# Patient Record
Sex: Female | Born: 2000 | Hispanic: Yes | Marital: Single | Smoking: Never smoker
Health system: Southern US, Community
[De-identification: ages and names within clinical notes are randomized; demographics above are authoritative.]

## PROBLEM LIST (undated history)

## (undated) ENCOUNTER — Ambulatory Visit: Payer: Commercial Managed Care - PPO

## (undated) ENCOUNTER — Emergency Department (HOSPITAL_COMMUNITY): Payer: Commercial Managed Care - PPO

---
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
## 2018-01-30 ENCOUNTER — Encounter: Payer: Self-pay | Admitting: Family Medicine

## 2018-01-30 ENCOUNTER — Ambulatory Visit (INDEPENDENT_AMBULATORY_CARE_PROVIDER_SITE_OTHER): Payer: Medicaid Other | Admitting: Family Medicine

## 2018-01-30 VITALS — BP 110/72 | HR 82 | Temp 98.4°F | Resp 17 | Ht 62.0 in | Wt 133.8 lb

## 2018-01-30 DIAGNOSIS — Z23 Encounter for immunization: Secondary | ICD-10-CM

## 2018-01-30 DIAGNOSIS — Z0000 Encounter for general adult medical examination without abnormal findings: Secondary | ICD-10-CM | POA: Diagnosis not present

## 2018-01-30 DIAGNOSIS — Z7689 Persons encountering health services in other specified circumstances: Secondary | ICD-10-CM | POA: Diagnosis not present

## 2018-01-30 DIAGNOSIS — Z130 Encounter for screening for diseases of the blood and blood-forming organs and certain disorders involving the immune mechanism: Secondary | ICD-10-CM

## 2018-01-30 NOTE — Progress Notes (Signed)
Deanna Ward, is a 17 y.o. female  ZOX:[id number]  [id number]:[id number]  DOB - 2000-07-08  CC:  Chief Complaint  Patient presents with  . Establish Care    needs new PCP. will need to return for school CPE       HPI: Deanna Ward is a 17 y.o. female is here today to establish care.   Deanna Ward miscarriage.    Today's visit:  Deanna Ward is here today for a school physical. She is completing medical assistant school .  She is in need of a physical, review of vaccinations records, and completion of school forms. Denies any significant medical history. Patient denies new headaches, chest pain, abdominal pain, nausea, new weakness, numbness or tingling, SOB, edema, or worrisome cough.   Current medications:No current outpatient medications on file.   Pertinent family medical history: family history includes Healthy in her father, mother, sister, sister, and sister.   No Known Allergies  Social History   Socioeconomic History  . Marital status: Single    Spouse name: Not on file  . Number of children: Not on file  . Years of education: Not on file  . Highest education level: Not on file  Occupational History  . Not on file  Social Needs  . Financial resource strain: Not on file  . Food insecurity:    Worry: Not on file    Inability: Not on file  . Transportation needs:    Medical: Not on file    Non-medical: Not on file  Tobacco Use  . Smoking status: Never Smoker  . Smokeless tobacco: Never Used  Substance and Sexual Activity  . Alcohol use: No  . Drug use: No  . Sexual activity: Yes  Lifestyle  . Physical activity:    Days per week: Not on file    Minutes per session: Not on file  . Stress: Not on file  Relationships  . Social connections:    Talks on phone: Not on file    Gets together: Not on file    Attends religious service: Not on file    Active member of club or organization: Not on file    Attends meetings of clubs or organizations: Not on file     Relationship status: Not on file  . Intimate partner violence:    Fear of current or ex partner: Not on file    Emotionally abused: Not on file    Physically abused: Not on file    Forced sexual activity: Not on file  Other Topics Concern  . Not on file  Social History Narrative  . Not on file    Review of Systems: Constitutional: Negative for fever, chills, diaphoresis, activity change, appetite change and fatigue. HENT: Negative for ear pain, nosebleeds, congestion, facial swelling, rhinorrhea, neck pain, neck stiffness and ear discharge.  Eyes: Negative for pain, discharge, redness, itching and visual disturbance. Respiratory: Negative for cough, choking, chest tightness, shortness of breath, wheezing and stridor.  Cardiovascular: Negative for chest pain, palpitations and leg swelling. Gastrointestinal: Negative for abdominal distention. Genitourinary: Negative for dysuria, urgency, frequency, hematuria, flank pain, decreased urine volume, difficulty urinating. Musculoskeletal: Negative for back pain, joint swelling, arthralgia and gait problem. Neurological: Negative for dizziness, tremors, seizures, syncope, facial asymmetry, speech difficulty, weakness, light-headedness, numbness and headaches.  Hematological: Negative for adenopathy. Does not bruise/bleed easily. Psychiatric/Behavioral: Negative for hallucinations, behavioral problems, confusion, dysphoric mood, decreased concentration and agitation.  Objective:   Vitals:   01/30/18 0853  BP: 110/72  Pulse: 82  Resp: 17  Temp: 98.4 F (36.9 C)  SpO2: 99%    BP Readings from Last 3 Encounters:  01/30/18 110/72  05/13/17 112/67  11/17/16 (!) 127/55    Filed Weights   01/30/18 0853  Weight: 133 lb 12.8 oz (60.7 kg)      Physical Exam: Constitutional: Patient appears well-developed and well-nourished. No distress. HENT: Normocephalic, atraumatic, External right and left ear normal. Oropharynx is clear and  moist.  Eyes: Conjunctivae and EOM are normal. PERRLA, no scleral icterus. Neck: Normal ROM. Neck supple. No JVD. No tracheal deviation. No thyromegaly. CVS: RRR, S1/S2 +, no murmurs, no gallops, no carotid bruit.  Pulmonary: Effort and breath sounds normal, no stridor, rhonchi, wheezes, rales.  Abdominal: Soft. BS +, no distension, tenderness, rebound or guarding.  Musculoskeletal: Normal range of motion. No edema and no tenderness.  Neuro: Alert. Normal muscle tone coordination. Normal gait. BUE and BLE strength 5/5.  Bilateral hand grips symmetrical. No cranial nerve deficit. Skin: Skin is warm and dry. No rash noted. Not diaphoretic. No erythema. No pallor. Psychiatric: Normal mood and affect. Behavior, judgment, thought content normal. Lab Results (prior encounters)  Lab Results  Component Value Date   WBC 9.1 11/16/2016   HGB 11.8 (L) 11/16/2016   HCT 35.5 11/16/2016   MCV 75.7 (L) 11/16/2016   PLT 271 11/16/2016   Lab Results  Component Value Date   CREATININE 0.51 11/16/2016   BUN 8 11/16/2016   NA 136 11/16/2016   K 3.7 11/16/2016   CL 103 11/16/2016   CO2 24 11/16/2016       Assessment and plan:  1. Encounter to establish care 2. Screening for deficiency anemia - CBC with Differential  3. Laboratory tests ordered as part of a complete physical exam (CPE) - Hemoglobin A1c - Urinalysis  4. Need for meningococcal vaccination, administered.   5. Annual physical exam Age-specific anticipatory guidance    Orders Placed This Encounter  Procedures  . Tdap vaccine greater than or equal to 7yo IM  . Meningococcal MCV4O(Menveo)  . Hemoglobin A1c  . CBC with Differential  . Urinalysis    Return to pick-up completed forms 1 week from today.   The patient was given clear instructions to go to ER or return to medical center if symptoms don't improve, worsen or new problems develop. The patient verbalized understanding. The patient was advised  to call and obtain  lab results if they haven't heard anything from out office within 7-10 business days.  [name], FNP Primary Care at [facility] [address] [place] [address] [phone]: [phone]    This note has been created with Dragon speech recognition software and Paediatric nurse. Any transcriptional errors are unintentional.

## 2018-01-30 NOTE — Patient Instructions (Signed)
Thank you for choosing Primary Care at [facility] to be your medical home!    Deanna Ward was seen by [name], FNP today.   [name] primary care provider is [name], FNP.   For the best care possible, you should try to see [name], FNP-C whenever you come to the clinic.   We look forward to seeing you again soon!  If you have any questions about your visit today, please call us at [phone] or feel free to reach your primary care provider via MyChart.

## 2018-01-31 LAB — URINALYSIS
Bilirubin, UA: NEGATIVE
Glucose, UA: NEGATIVE
KETONES UA: NEGATIVE
LEUKOCYTES UA: NEGATIVE
Nitrite, UA: NEGATIVE
PROTEIN UA: NEGATIVE
SPEC GRAV UA: 1.024 (ref 1.005–1.030)
Urobilinogen, Ur: 0.2 mg/dL (ref 0.2–1.0)
pH, UA: 5.5 (ref 5.0–7.5)

## 2018-01-31 LAB — CBC WITH DIFFERENTIAL/PLATELET
BASOS ABS: 0 10*3/uL (ref 0.0–0.2)
BASOS: 0 %
EOS (ABSOLUTE): 0.3 10*3/uL (ref 0.0–0.4)
Eos: 4 %
Hematocrit: 34.4 % (ref 34.0–46.6)
Hemoglobin: 10.8 g/dL — ABNORMAL LOW (ref 11.1–15.9)
Immature Grans (Abs): 0 10*3/uL (ref 0.0–0.1)
Immature Granulocytes: 0 %
LYMPHS ABS: 2 10*3/uL (ref 0.7–3.1)
LYMPHS: 29 %
MCH: 25.8 pg — AB (ref 26.6–33.0)
MCHC: 31.4 g/dL — AB (ref 31.5–35.7)
MCV: 82 fL (ref 79–97)
MONOCYTES: 6 %
Monocytes Absolute: 0.4 10*3/uL (ref 0.1–0.9)
NEUTROS ABS: 4.1 10*3/uL (ref 1.4–7.0)
NEUTROS PCT: 61 %
PLATELETS: 256 10*3/uL (ref 150–450)
RBC: 4.18 x10E6/uL (ref 3.77–5.28)
RDW: 14.5 % (ref 12.3–15.4)
WBC: 6.8 10*3/uL (ref 3.4–10.8)

## 2018-01-31 LAB — HEMOGLOBIN A1C
ESTIMATED AVERAGE GLUCOSE: 111 mg/dL
HEMOGLOBIN A1C: 5.5 % (ref 4.8–5.6)

## 2018-02-02 ENCOUNTER — Telehealth: Payer: Self-pay | Admitting: Family Medicine

## 2018-02-02 MED ORDER — FERROUS SULFATE 325 (65 FE) MG PO TABS: 325.0000 mg | ORAL_TABLET | Freq: Two times a day (BID) | ORAL | 3 refills | Status: DC

## 2018-02-02 NOTE — Telephone Encounter (Signed)
Contact patient to advise of low hemoglobin. Prescribing iron replacement ferrous 325 mg twice daily with meals. Would like for her to return in 3 months to recheck hemoglobin. Please complete the remainder of the physical form and contact patient tomorrow to pick-up paperwork.    [name], FNP Primary Care at [facility] [address] [place] [address] [phone]: [phone]

## 2018-02-02 NOTE — Addendum Note (Signed)
Addended by: [name] on: 02/02/2018 11:13 AM   Modules accepted: Orders

## 2018-02-02 NOTE — Telephone Encounter (Signed)
LTMCB 

## 2018-02-04 NOTE — Telephone Encounter (Signed)
LMTCB

## 2018-02-04 NOTE — Telephone Encounter (Signed)
Patient returned call. Went over lab results & recommendations. Patient expressed understanding. Will call back to schedule lab appt.

## 2018-02-17 ENCOUNTER — Ambulatory Visit: Payer: Medicaid Other | Admitting: Family Medicine

## 2018-03-03 ENCOUNTER — Encounter: Payer: Self-pay | Admitting: Family Medicine

## 2018-03-03 ENCOUNTER — Ambulatory Visit (INDEPENDENT_AMBULATORY_CARE_PROVIDER_SITE_OTHER): Payer: Medicaid Other | Admitting: Family Medicine

## 2018-03-03 VITALS — BP 113/75 | HR 81 | Resp 17 | Ht 62.0 in | Wt 130.0 lb

## 2018-03-03 DIAGNOSIS — N921 Excessive and frequent menstruation with irregular cycle: Secondary | ICD-10-CM | POA: Insufficient documentation

## 2018-03-03 DIAGNOSIS — D509 Iron deficiency anemia, unspecified: Secondary | ICD-10-CM | POA: Insufficient documentation

## 2018-03-03 DIAGNOSIS — Z113 Encounter for screening for infections with a predominantly sexual mode of transmission: Secondary | ICD-10-CM

## 2018-03-03 DIAGNOSIS — N920 Excessive and frequent menstruation with regular cycle: Secondary | ICD-10-CM | POA: Insufficient documentation

## 2018-03-03 NOTE — Progress Notes (Signed)
Acute Office Visit  Subjective:    Patient ID: Deanna Ward, female    DOB: July 11, 2000, 17 y.o.   MRN: [id number]  Chief Complaint  Patient presents with  . Menstrual Problem    has been having two 6 day periods a month since September. no hx of irregular periods    HPI Patient is in today for for evaluation of irregular menstrual periods.  She reports since September she has had at least 2 episodes of menstrual bleeding per month.  The menstrual bleeding episodes are now lasting on average 6 days per cycle.  On average her normal cycle last anywhere from 3 to 5 days.  She has never had a issue with frequent menstrual bleeding in the past.  She reports that periods are painless without cramping.  She is soaking on average 1 pack/h upon awakening in the morning her pads are soaked during these menstrual periods.  She reports that she has a history of heavy menstrual periods which include clotting.  No prior evaluation for underlying cause of such heavy cycles.  She also has had a spontaneous abortion last year at about 5 weeks.  gestation.  No known cause was identified for miscarriage. She is sexually active denies any known exposures to STDs.  Family History  Problem Relation Age of Onset  . Healthy Mother   . Healthy Father   . Healthy Sister   . Healthy Sister   . Healthy Sister   . Stroke Neg Hx   . Heart disease Neg Hx   . Cancer Neg Hx     Social History   Socioeconomic History  . Marital status: Single    Spouse name: Not on file  . Number of children: Not on file  . Years of education: Not on file  . Highest education level: Not on file  Occupational History  . Not on file  Social Needs  . Financial resource strain: Not on file  . Food insecurity:    Worry: Not on file    Inability: Not on file  . Transportation needs:    Medical: Not on file    Non-medical: Not on file  Tobacco Use  . Smoking status: Never Smoker  . Smokeless tobacco: Never Used   Substance and Sexual Activity  . Alcohol use: No  . Drug use: No  . Sexual activity: Yes  Lifestyle  . Physical activity:    Days per week: Not on file    Minutes per session: Not on file  . Stress: Not on file  Relationships  . Social connections:    Talks on phone: Not on file    Gets together: Not on file    Attends religious service: Not on file    Active member of club or organization: Not on file    Attends meetings of clubs or organizations: Not on file    Relationship status: Not on file  . Intimate partner violence:    Fear of current or ex partner: Not on file    Emotionally abused: Not on file    Physically abused: Not on file    Forced sexual activity: Not on file  Other Topics Concern  . Not on file  Social History Narrative  . Not on file    Outpatient Medications Prior to Visit  Medication Sig Dispense Refill  . ferrous sulfate (FERROUSUL) 325 (65 FE) MG tablet Take 1 tablet (325 mg total) by mouth 2 (two) times daily with a  meal. 60 tablet 3   No facility-administered medications prior to visit.     No Known Allergies  ROS See HPI for pertinent negatives    Objective:    Physical Exam  BP 113/75   Pulse 81   Resp 17   Ht 5\' 2"  (1.575 m)   Wt 130 lb (59 kg)   LMP 02/19/2018 (Exact Date)   SpO2 98%   BMI 23.78 kg/m  Wt Readings from Last 3 Encounters:  03/03/18 130 lb (59 kg) (55 %, Z= 0.13)*  01/30/18 133 lb 12.8 oz (60.7 kg) (62 %, Z= 0.30)*  05/13/17 148 lb (67.1 kg) (81 %, Z= 0.89)*   * Growth percentiles are based on CDC (Girls, 2-20 Years) data.   BP 113/75   Pulse 81   Resp 17   Ht 5\' 2"  (1.575 m)   Wt 130 lb (59 kg)   LMP 02/19/2018 (Exact Date)   SpO2 98%   BMI 23.78 kg/m  General appearance: alert, cooperative and appears stated age Head: Normocephalic, without obvious abnormality, atraumatic Throat: lips, mucosa, and tongue normal; teeth and gums normal Lungs: clear to auscultation bilaterally Heart: regular rate and  rhythm, S1, S2 normal, no murmur, click, rub or gallop Abdomen: soft, non-tender; bowel sounds normal; no masses,  no organomegaly Skin: Skin color, texture, turgor normal. No rashes or lesions Health Maintenance Due  Topic Date Due  . CHLAMYDIA SCREENING  10/09/2013  . HIV Screening  10/09/2013  . INFLUENZA VACCINE  10/30/2017    There are no preventive care reminders to display for this patient.   No results found for: TSH Lab Results  Component Value Date   WBC 6.8 01/30/2018   HGB 10.8 (L) 01/30/2018   HCT 34.4 01/30/2018   MCV 82 01/30/2018   PLT 256 01/30/2018   Lab Results  Component Value Date   NA 136 11/16/2016   K 3.7 11/16/2016   CO2 24 11/16/2016   GLUCOSE 88 11/16/2016   BUN 8 11/16/2016   CREATININE 0.51 11/16/2016   BILITOT 0.4 11/16/2016   ALKPHOS 66 11/16/2016   AST 24 11/16/2016   ALT 23 11/16/2016   PROT 7.6 11/16/2016   ALBUMIN 4.2 11/16/2016   CALCIUM 9.6 11/16/2016   ANIONGAP 9 11/16/2016   Lab Results  Component Value Date   HGBA1C 5.5 01/30/2018       Assessment & Plan:   Problem List Items Addressed This Visit    None    Visit Diagnoses    Menorrhagia with irregular cycle    -  Primary   Relevant Orders   CBC with Differential   Excessive or frequent menstruation       Relevant Orders   CBC with Differential   Iron deficiency anemia, unspecified iron deficiency anemia type       Relevant Orders   Iron, TIBC and Ferritin Panel      1. Menorrhagia with irregular cycle - CBC with Differential - US Pelvic Complete With Transvaginal; Future  2. Excessive or frequent menstruation - CBC with Differential - US Pelvic Complete With Transvaginal; Future  3. Iron deficiency anemia, unspecified iron deficiency anemia type -We will repeat a CBC and iron panel.  Recommended continuing daily iron supplements as previously prescribed.  Last hemoglobin was 10.8. - Iron, TIBC and Ferritin Panel  4. Screen for STD (sexually transmitted  disease) - GC/Chlamydia Probe Amp([facility])  Advised patient if blood work, STD screen, ultrasound yielded no definitive calls of irregularity and  bleeding will start oral contraceptive therapy try to regulate periods and if warranted refer her to GYN.  Orders Placed This Encounter  Procedures  . GC/Chlamydia Probe Amp([facility])  . US Pelvic Complete With Transvaginal  . CBC with Differential  . Iron, TIBC and Ferritin Panel    Joaquin CourtsKimberly Alya Smaltz, FNP [facility] [address], [place] [id number] [phone]: [phone]

## 2018-03-03 NOTE — Patient Instructions (Signed)
-  You will be notified once your ultrasound is scheduled.  - continue iron supplements as prescribed  -you will be notified of any abnormal labs  -if no cause is identified for menstrual irregularity, I will start you on oral contraceptives in efforts to regulate your cycle.     Dysfunctional Uterine Bleeding Dysfunctional uterine bleeding is abnormal bleeding from the uterus. Dysfunctional uterine bleeding includes:  A period that comes earlier or later than usual.  A period that is lighter, heavier, or has blood clots.  Bleeding between periods.  Skipping one or more periods.  Bleeding after sexual intercourse.  Bleeding after menopause.  Follow these instructions at home: Pay attention to any changes in your symptoms. Follow these instructions to help with your condition: Eating and drinking  Eat well-balanced meals. Include foods that are high in iron, such as liver, meat, shellfish, green leafy vegetables, and eggs.  If you become constipated: ? Drink plenty of water. ? Eat fruits and vegetables that are high in water and fiber, such as spinach, carrots, raspberries, apples, and mango. Medicines  Take over-the-counter and prescription medicines only as told by your health care provider.  Do not change medicines without talking with your health care provider.  Aspirin or medicines that contain aspirin may make the bleeding worse. Do not take those medicines: ? During the week before your period. ? During your period.  If you were prescribed iron pills, take them as told by your health care provider. Iron pills help to replace iron that your body loses because of this condition. Activity  If you need to change your sanitary pad or tampon more than one time every 2 hours: ? Lie in bed with your feet raised (elevated). ? Place a cold pack on your lower abdomen. ? Rest as much as possible until the bleeding stops or slows down.  Do not try to lose weight until the  bleeding has stopped and your blood iron level is back to normal. Other Instructions  For two months, write down: ? When your period starts. ? When your period ends. ? When any abnormal bleeding occurs. ? What problems you notice.  Keep all follow up visits as told by your health care provider. This is important. Contact a health care provider if:  You get light-headed or weak.  You have nausea and vomiting.  You cannot eat or drink without vomiting.  You feel dizzy or have diarrhea while you are taking medicines.  You are taking birth control pills or hormones, and you want to change them or stop taking them. Get help right away if:  You develop a fever or chills.  You need to change your sanitary pad or tampon more than one time per hour.  Your bleeding becomes heavier, or your flow contains clots more often.  You develop pain in your abdomen.  You lose consciousness.  You develop a rash. This information is not intended to replace advice given to you by your health care provider. Make sure you discuss any questions you have with your health care provider. Document Released: 03/15/2000 Document Revised: 08/24/2015 Document Reviewed: 06/13/2014 Elsevier Interactive Patient Education  [employer]2018 Elsevier Inc.

## 2018-03-04 LAB — CBC WITH DIFFERENTIAL/PLATELET
BASOS ABS: 0 10*3/uL (ref 0.0–0.2)
Basos: 0 %
EOS (ABSOLUTE): 0.1 10*3/uL (ref 0.0–0.4)
EOS: 2 %
HEMOGLOBIN: 12.4 g/dL (ref 11.1–15.9)
Hematocrit: 37.1 % (ref 34.0–46.6)
Immature Grans (Abs): 0 10*3/uL (ref 0.0–0.1)
Immature Granulocytes: 0 %
LYMPHS: 28 %
Lymphocytes Absolute: 1.7 10*3/uL (ref 0.7–3.1)
MCH: 28.5 pg (ref 26.6–33.0)
MCHC: 33.4 g/dL (ref 31.5–35.7)
MCV: 85 fL (ref 79–97)
MONOCYTES: 6 %
Monocytes Absolute: 0.4 10*3/uL (ref 0.1–0.9)
NEUTROS ABS: 4 10*3/uL (ref 1.4–7.0)
Neutrophils: 64 %
Platelets: 273 10*3/uL (ref 150–450)
RBC: 4.35 x10E6/uL (ref 3.77–5.28)
RDW: 16.7 % — ABNORMAL HIGH (ref 12.3–15.4)
WBC: 6.3 10*3/uL (ref 3.4–10.8)

## 2018-03-04 LAB — IRON,TIBC AND FERRITIN PANEL
FERRITIN: 32 ng/mL (ref 15–77)
Iron Saturation: 18 % (ref 15–55)
Iron: 63 ug/dL (ref 27–159)
TIBC: 347 ug/dL (ref 250–450)
UIBC: 284 ug/dL (ref 131–425)

## 2018-03-04 LAB — GC/CHLAMYDIA PROBE AMP
Chlamydia trachomatis, NAA: NEGATIVE
Neisseria gonorrhoeae by PCR: NEGATIVE

## 2018-03-05 ENCOUNTER — Telehealth: Payer: Self-pay | Admitting: Family Medicine

## 2018-03-05 NOTE — Telephone Encounter (Signed)
Please schedule ultrasound which is noted and orders.  This note completed.  Also advised patient that all of her recent labs were unremarkable and within expected range.  I do recommend she proceed with ultrasound.

## 2018-03-06 NOTE — Telephone Encounter (Signed)
Called [employer] to get US approved. Approval number [id number].

## 2018-03-06 NOTE — Telephone Encounter (Signed)
US is scheduled for 03/16/18 @ 11:30. [facility]15 with full bladder.  Called patient & notified of her lab results as well as ultrasound appointment.

## 2018-03-11 NOTE — Telephone Encounter (Signed)
CPT [id number] authorization # [id number]

## 2018-03-16 ENCOUNTER — Ambulatory Visit (HOSPITAL_COMMUNITY)
Admission: RE | Admit: 2018-03-16 | Discharge: 2018-03-16 | Disposition: A | Discharge status: Home / Self Care | Payer: Medicaid Other | Source: Ambulatory Visit | Attending: Family Medicine | Admitting: Family Medicine

## 2018-03-16 DIAGNOSIS — N920 Excessive and frequent menstruation with regular cycle: Secondary | ICD-10-CM

## 2018-03-16 DIAGNOSIS — N921 Excessive and frequent menstruation with irregular cycle: Secondary | ICD-10-CM

## 2018-03-17 ENCOUNTER — Other Ambulatory Visit: Payer: Self-pay | Admitting: Family Medicine

## 2018-03-17 DIAGNOSIS — N939 Abnormal uterine and vaginal bleeding, unspecified: Secondary | ICD-10-CM

## 2018-03-17 NOTE — Telephone Encounter (Signed)
Patient called stating that she has made a decision and would like to speak to CMA, please follow up.

## 2018-03-17 NOTE — Telephone Encounter (Signed)
Contact patient to advise recent ultrasound showed no abnormalities or cause for abnormal menstraul  bleeding patterns. I would like to start her on oral contraceptives (loestrin) in efforts to regulate her cycle and refer her to gynecology for a second opinion. If she is agreeable, I can send medication to pharmacy and process referral.

## 2018-03-17 NOTE — Telephone Encounter (Signed)
Called patient. She states that she would like to think everything over & then give us a call back with decision regarding OCP & GYN referral.

## 2018-03-18 MED ORDER — NORETHIN ACE-ETH ESTRAD-FE 1.5-30 MG-MCG PO TABS: 1.0000 | ORAL_TABLET | Freq: Every day | ORAL | 12 refills | Status: DC

## 2018-03-18 NOTE — Addendum Note (Signed)
Addended by: Heidi DachWALKER, Wakeelah Solan M on: 03/18/2018 10:41 AM   Modules accepted: Orders

## 2018-03-18 NOTE — Telephone Encounter (Signed)
Patient has decided to try the OCP. Will send to her pharmacy.

## 2018-06-01 ENCOUNTER — Telehealth: Payer: Self-pay | Admitting: Obstetrics

## 2018-06-01 NOTE — Telephone Encounter (Signed)
Deanna Ward Self [phone]  Wendolyn left a voice message with her phone number to call back, I called back and unable to leave voice mail, The voice mail is not set up.

## 2018-06-10 ENCOUNTER — Encounter: Payer: Self-pay | Admitting: Family Medicine

## 2018-06-10 ENCOUNTER — Ambulatory Visit (INDEPENDENT_AMBULATORY_CARE_PROVIDER_SITE_OTHER): Payer: Medicaid Other | Admitting: Family Medicine

## 2018-06-10 ENCOUNTER — Other Ambulatory Visit: Payer: Self-pay

## 2018-06-10 VITALS — BP 112/72 | HR 87 | Resp 17 | Ht 62.0 in | Wt 135.4 lb

## 2018-06-10 DIAGNOSIS — R21 Rash and other nonspecific skin eruption: Secondary | ICD-10-CM | POA: Diagnosis not present

## 2018-06-10 DIAGNOSIS — J302 Other seasonal allergic rhinitis: Secondary | ICD-10-CM

## 2018-06-10 MED ORDER — CETIRIZINE HCL 10 MG PO TABS: 10.0000 mg | ORAL_TABLET | Freq: Every day | ORAL | 11 refills | Status: DC

## 2018-06-10 MED ORDER — CLOTRIMAZOLE-BETAMETHASONE 1-0.05 % EX CREA: 1.0000 "application " | TOPICAL_CREAM | Freq: Two times a day (BID) | CUTANEOUS | 1 refills | Status: DC

## 2018-06-10 MED ORDER — FLUTICASONE PROPIONATE 50 MCG/ACT NA SUSP: 2.0000 | Freq: Every day | NASAL | 6 refills | Status: DC

## 2018-06-10 MED ORDER — SULFAMETHOXAZOLE-TRIMETHOPRIM 800-160 MG PO TABS: 1.0000 | ORAL_TABLET | Freq: Two times a day (BID) | ORAL | 0 refills | Status: DC

## 2018-06-10 NOTE — Progress Notes (Signed)
Patient ID: Deanna Ward, female    DOB: 2001/02/26, 18 y.o.   MRN: [id number]  PCP: [name], FNP  Chief Complaint  Patient presents with  . Rash    red, splotchy, itchy rash on her chest x 1 month. changed detergent w/no relief.    Subjective:  HPI  Deanna Ward is a 18 y.o. female presents for evaluation skin rash localized to chest. Onset: October initially notices red rash on chest with small acne / pustular type lesions present Location: Chest extending to lower neck Duration: Current flare of rash present for one month Symptoms: Itching and irritations  Exacerbating factors: Unknown. She discontinued laundry detergent and lotions. This did not improve rash. Alleviating factors: None identified  OTC/Prescription medications used: Evaluation: None previously  Patient endorses no prior contact with an animal, plant, or experience an insect bite. Unrelated complaint, patient is requesting medication for chronic seasonal allergies. Social History   Socioeconomic History  . Marital status: Single    Spouse name: Not on file  . Number of children: Not on file  . Years of education: Not on file  . Highest education level: Not on file  Occupational History  . Not on file  Social Needs  . Financial resource strain: Not on file  . Food insecurity:    Worry: Not on file    Inability: Not on file  . Transportation needs:    Medical: Not on file    Non-medical: Not on file  Tobacco Use  . Smoking status: Never Smoker  . Smokeless tobacco: Never Used  Substance and Sexual Activity  . Alcohol use: No  . Drug use: No  . Sexual activity: Yes  Lifestyle  . Physical activity:    Days per week: Not on file    Minutes per session: Not on file  . Stress: Not on file  Relationships  . Social connections:    Talks on phone: Not on file    Gets together: Not on file    Attends religious service: Not on file    Active member of club or organization: Not  on file    Attends meetings of clubs or organizations: Not on file    Relationship status: Not on file  . Intimate partner violence:    Fear of current or ex partner: Not on file    Emotionally abused: Not on file    Physically abused: Not on file    Forced sexual activity: Not on file  Other Topics Concern  . Not on file  Social History Narrative  . Not on file    Family History  Problem Relation Age of Onset  . Healthy Mother   . Healthy Father   . Healthy Sister   . Healthy Sister   . Healthy Sister   . Stroke Neg Hx   . Heart disease Neg Hx   . Cancer Neg Hx      Review of Systems Pertinent negatives listed in HPI Patient Active Problem List   Diagnosis Date Noted  . Menorrhagia with irregular cycle 03/03/2018  . Excessive or frequent menstruation 03/03/2018  . Iron deficiency anemia 03/03/2018    No Known Allergies  Prior to Admission medications   Medication Sig Start Date End Date Taking? Authorizing Provider  ferrous sulfate (FERROUSUL) 325 (65 FE) MG tablet Take 1 tablet (325 mg total) by mouth 2 (two) times daily with a meal. 02/02/18  Yes [name], FNP  norethindrone-ethinyl estradiol-iron (MICROGESTIN  FE,GILDESS FE,LOESTRIN FE) 1.5-30 MG-MCG tablet Take 1 tablet by mouth daily. 03/18/18  Yes [name], FNP    Past Medical, Surgical Family and Social History reviewed and updated.    Objective:   Today's Vitals   06/10/18 1027  BP: 112/72  Pulse: 87  Resp: 17  SpO2: 96%  Weight: 135 lb 6.4 oz (61.4 kg)  Height: 5\' 2"  (1.575 m)    Wt Readings from Last 3 Encounters:  06/10/18 135 lb 6.4 oz (61.4 kg) (63 %, Z= 0.33)*  03/03/18 130 lb (59 kg) (55 %, Z= 0.13)*  01/30/18 133 lb 12.8 oz (60.7 kg) (62 %, Z= 0.30)*   * Growth percentiles are based on CDC (Girls, 2-20 Years) data.     Physical Exam General appearance: alert, well developed, well nourished, cooperative and in no distress Head: Normocephalic, without obvious  abnormality, atraumatic Respiratory: Respirations even and unlabored, normal respiratory rate Heart: rate and rhythm normal. No gallop or murmurs noted on exam  Extremities: No gross deformities Skin: Erythematous rash with fine pustular lesion    Psych: Appropriate mood and affect. Neurologic: Mental status: Alert, oriented to person, place, and time, thought content appropriate.  No results found for: POCGLU  Lab Results  Component Value Date   HGBA1C 5.5 01/30/2018      Assessment & Plan:  1. Rash and nonspecific skin eruption -suspect MRSA/Staph WOUND CULTURE pending -Trial antibiotic treatment with Bactrim  -For irritation will trial Lotrisone cream BID as needed for itching   2. Seasonal allergies Trial Cetrizine and Flonase   Orders Placed This Encounter  Procedures  . WOUND CULTURE   Meds ordered this encounter  Medications  . sulfamethoxazole-trimethoprim (BACTRIM DS,SEPTRA DS) 800-160 MG tablet    Sig: Take 1 tablet by mouth 2 (two) times daily.    Dispense:  10 tablet    Refill:  0  . clotrimazole-betamethasone (LOTRISONE) cream    Sig: Apply 1 application topically 2 (two) times daily.    Dispense:  60 g    Refill:  1  . cetirizine (ZYRTEC) 10 MG tablet    Sig: Take 1 tablet (10 mg total) by mouth daily.    Dispense:  30 tablet    Refill:  11  . fluticasone (FLONASE) 50 MCG/ACT nasal spray    Sig: Place 2 sprays into both nostrils daily.    Dispense:  16 g    Refill:  6     -The patient was given clear instructions to go to ER or return to medical center if symptoms do not improve, worsen or new problems develop. The patient verbalized understanding.    [name], FNP Primary Care at [facility] [address] [place] [address] [phone]: [phone]

## 2018-06-10 NOTE — Patient Instructions (Signed)
Rash, Adult    A rash is a change in the color of your skin. A rash can also change the way your skin feels. There are many different conditions and factors that can cause a rash.  Follow these instructions at home:  The goal of treatment is to stop the itching and keep the rash from spreading. Watch for any changes in your symptoms. Let your doctor know about them. Follow these instructions to help with your condition:  Medicine  Take or apply over-the-counter and prescription medicines only as told by your doctor. These may include medicines:   To treat red or swollen skin (corticosteroid creams).   To treat itching.   To treat an allergy (oral antihistamines).   To treat very bad symptoms (oral corticosteroids).    Skin care   Put cool cloths (compresses) on the affected areas.   Do not scratch or rub your skin.   Avoid covering the rash. Make sure that the rash is exposed to air as much as possible.  Managing itching and discomfort   Avoid hot showers or baths. These can make itching worse. A cold shower may help.   Try taking a bath with:  ? Epsom salts. You can get these at your local pharmacy or grocery store. Follow the instructions on the package.  ? Baking soda. Pour a small amount into the bath as told by your doctor.  ? Colloidal oatmeal. You can get this at your local pharmacy or grocery store. Follow the instructions on the package.   Try putting baking soda paste onto your skin. Stir water into baking soda until it gets like a paste.   Try putting on a lotion that relieves itchiness (calamine lotion).   Keep cool and out of the sun. Sweating and being hot can make itching worse.  General instructions     Rest as needed.   Drink enough fluid to keep your pee (urine) pale yellow.   Wear loose-fitting clothing.   Avoid scented soaps, detergents, and perfumes. Use gentle soaps, detergents, perfumes, and other cosmetic products.   Avoid anything that causes your rash. Keep a journal to  help track what causes your rash. Write down:  ? What you eat.  ? What cosmetic products you use.  ? What you drink.  ? What you wear. This includes jewelry.   Keep all follow-up visits as told by your doctor. This is important.  Contact a doctor if:   You sweat at night.   You lose weight.   You pee (urinate) more than normal.   You pee less than normal, or you notice that your pee is a darker color than normal.   You feel weak.   You throw up (vomit).   Your skin or the whites of your eyes look yellow (jaundice).   Your skin:  ? Tingles.  ? Is numb.   Your rash:  ? Does not go away after a few days.  ? Gets worse.   You are:  ? More thirsty than normal.  ? More tired than normal.   You have:  ? New symptoms.  ? Pain in your belly (abdomen).  ? A fever.  ? Watery poop (diarrhea).  Get help right away if:   You have a fever and your symptoms suddenly get worse.   You start to feel mixed up (confused).   You have a very bad headache or a stiff neck.   You have very bad joint pains   or stiffness.   You have jerky movements that you cannot control (seizure).   Your rash covers all or most of your body. The rash may or may not be painful.   You have blisters that:  ? Are on top of the rash.  ? Grow larger.  ? Grow together.  ? Are painful.  ? Are inside your nose or mouth.   You have a rash that:  ? Looks like purple pinprick-sized spots all over your body.  ? Has a "bull's eye" or looks like a target.  ? Is red and painful, causes your skin to peel, and is not from being in the sun too long.  Summary   A rash is a change in the color of your skin. A rash can also change the way your skin feels.   The goal of treatment is to stop the itching and keep the rash from spreading.   Take or apply over-the-counter and prescription medicines only as told by your doctor.   Contact a doctor if you have new symptoms or symptoms that get worse.   Keep all follow-up visits as told by your doctor. This is  important.  This information is not intended to replace advice given to you by your health care provider. Make sure you discuss any questions you have with your health care provider.  Document Released: 09/04/2007 Document Revised: 10/20/2017 Document Reviewed: 10/20/2017  Elsevier Interactive Patient Education  2019 Elsevier Inc.

## 2018-06-12 LAB — WOUND CULTURE: Organism ID, Bacteria: NONE SEEN

## 2018-06-16 ENCOUNTER — Telehealth: Payer: Self-pay | Admitting: Family Medicine

## 2018-06-16 NOTE — Telephone Encounter (Signed)
Patient notified of wound culture results & recommendations. States that she has completed her antibiotic & the rash has slightly improved. She would like to be referred to Dermatology. Prefers appointments to be on Mondays, Wednesdays or Fridays.

## 2018-06-16 NOTE — Telephone Encounter (Signed)
Notify patient that her wound culture was negative of growth. If her rash has resolved she can discontinue any remaining antibiotic that she may have remaining. If rash hasn't resolved, but is improving. Complete antibiotics. If no change, I can refer her to dermatology

## 2018-06-16 NOTE — Addendum Note (Signed)
Addended by: [name] on: 06/16/2018 12:06 PM   Modules accepted: Orders

## 2018-06-16 NOTE — Telephone Encounter (Signed)
I will place referral to [facility]. They will contact her directly to schedule an appointment.

## 2018-06-18 ENCOUNTER — Encounter: Payer: Medicaid Other | Admitting: Obstetrics and Gynecology

## 2018-07-14 ENCOUNTER — Telehealth: Payer: Self-pay | Admitting: Obstetrics & Gynecology

## 2018-07-14 NOTE — Telephone Encounter (Signed)
The patient called in to cancel her appointment. She stated she does not feel comfortable with the video visit and would rather reschedule the visit once the COVID19 restrictions are over.

## 2018-07-15 ENCOUNTER — Encounter: Payer: Medicaid Other | Admitting: Family Medicine

## 2018-07-15 ENCOUNTER — Encounter: Payer: Medicaid Other | Admitting: Obstetrics and Gynecology

## 2018-09-21 ENCOUNTER — Encounter: Payer: Self-pay | Admitting: Family Medicine

## 2018-10-26 ENCOUNTER — Telehealth: Payer: Self-pay

## 2018-10-26 NOTE — Telephone Encounter (Signed)

## 2018-10-27 ENCOUNTER — Other Ambulatory Visit: Payer: Self-pay

## 2018-10-27 ENCOUNTER — Telehealth: Payer: Self-pay | Admitting: Family Medicine

## 2018-10-27 ENCOUNTER — Encounter: Payer: Self-pay | Admitting: Internal Medicine

## 2018-10-27 ENCOUNTER — Ambulatory Visit (INDEPENDENT_AMBULATORY_CARE_PROVIDER_SITE_OTHER): Payer: Medicaid Other | Admitting: Internal Medicine

## 2018-10-27 VITALS — BP 109/63 | HR 76 | Temp 97.5°F | Resp 16 | Ht 62.0 in | Wt 133.2 lb

## 2018-10-27 DIAGNOSIS — R21 Rash and other nonspecific skin eruption: Secondary | ICD-10-CM | POA: Diagnosis not present

## 2018-10-27 DIAGNOSIS — Z3202 Encounter for pregnancy test, result negative: Secondary | ICD-10-CM

## 2018-10-27 DIAGNOSIS — Z3009 Encounter for other general counseling and advice on contraception: Secondary | ICD-10-CM

## 2018-10-27 LAB — POCT URINE PREGNANCY: Preg Test, Ur: NEGATIVE

## 2018-10-27 MED ORDER — FERROUS SULFATE 325 (65 FE) MG PO TABS: 325.0000 mg | ORAL_TABLET | Freq: Two times a day (BID) | ORAL | 3 refills | Status: DC

## 2018-10-27 NOTE — Progress Notes (Signed)
Patient ID: Mykell Genao, female    DOB: [birth year]  MRN: [id number]  CC: Contraception   Subjective: Deanna Ward is a 18 y.o. female who presents for family planning and rash Her concerns today include:   Pt reports having rash on chest for several mths.  Started after being on BCP for 2 mths.   She changed most of her body products which did not help. Given abx in March for possible MRSA but this did not make a difference either.  She thought it may be her birth control pills so she stopped taking them in April.  Since then the rash has cleared.  She is now wanting to explore all other options for family planning.  She is thinking about the IUD.  While on BCP menses were regular and less flow.  Since being of the pills, menses have been heavy with lots of cramps and lasting 6-7 days.   She currently does not have any children and does not plan on becoming pregnant within the next 2 to 3 years. Patient Active Problem List   Diagnosis Date Noted  . Rash and nonspecific skin eruption 06/10/2018  . Seasonal allergies 06/10/2018  . Menorrhagia with irregular cycle 03/03/2018  . Excessive or frequent menstruation 03/03/2018  . Iron deficiency anemia 03/03/2018     Current Outpatient Medications on File Prior to Visit  Medication Sig Dispense Refill  . fluticasone (FLONASE) 50 MCG/ACT nasal spray Place 2 sprays into both nostrils daily. 16 g 6   No current facility-administered medications on file prior to visit.     No Known Allergies  Social History   Socioeconomic History  . Marital status: Single    Spouse name: Not on file  . Number of children: Not on file  . Years of education: Not on file  . Highest education level: Not on file  Occupational History  . Not on file  Social Needs  . Financial resource strain: Not on file  . Food insecurity    Worry: Not on file    Inability: Not on file  . Transportation needs    Medical: Not on file   Non-medical: Not on file  Tobacco Use  . Smoking status: Never Smoker  . Smokeless tobacco: Never Used  Substance and Sexual Activity  . Alcohol use: No  . Drug use: No  . Sexual activity: Yes  Lifestyle  . Physical activity    Days per week: Not on file    Minutes per session: Not on file  . Stress: Not on file  Relationships  . Social Herbalist on phone: Not on file    Gets together: Not on file    Attends religious service: Not on file    Active member of club or organization: Not on file    Attends meetings of clubs or organizations: Not on file    Relationship status: Not on file  . Intimate partner violence    Fear of current or ex partner: Not on file    Emotionally abused: Not on file    Physically abused: Not on file    Forced sexual activity: Not on file  Other Topics Concern  . Not on file  Social History Narrative  . Not on file    Family History  Problem Relation Age of Onset  . Healthy Mother   . Healthy Father   . Healthy Sister   . Healthy Sister   . Healthy  Sister   . Stroke Neg Hx   . Heart disease Neg Hx   . Cancer Neg Hx     No past surgical history on file.  ROS: Review of Systems Negative except as stated above  PHYSICAL EXAM: BP 109/63   Pulse 76   Temp (!) 97.5 F (36.4 C) (Temporal)   Resp 16   Ht 5\' 2"  (1.575 m)   Wt 133 lb 3.2 oz (60.4 kg)   SpO2 96%   BMI 24.36 kg/m   Physical Exam  General appearance - alert, well appearing, and in no distress Mental status - normal mood, behavior, speech, dress, motor activity, and thought processes   CMP Latest Ref Rng & Units 11/16/2016  Glucose 65 - 99 mg/dL 88  BUN 6 - 20 mg/dL 8  Creatinine 9.620.44 - 9.521.00 mg/dL 8.410.51  Sodium 324135 - 401145 mmol/L 136  Potassium 3.5 - 5.1 mmol/L 3.7  Chloride 101 - 111 mmol/L 103  CO2 22 - 32 mmol/L 24  Calcium 8.9 - 10.3 mg/dL 9.6  Total Protein 6.5 - 8.1 g/dL 7.6  Total Bilirubin 0.3 - 1.2 mg/dL 0.4  Alkaline Phos 38 - 126 U/L 66  AST  15 - 41 U/L 24  ALT 14 - 54 U/L 23   Lipid Panel  No results found for: CHOL, TRIG, HDL, CHOLHDL, VLDL, LDLCALC, LDLDIRECT  CBC    Component Value Date/Time   WBC 6.3 03/03/2018 1458   WBC 9.1 11/16/2016 1710   RBC 4.35 03/03/2018 1458   RBC 4.69 11/16/2016 1710   HGB 12.4 03/03/2018 1458   HCT 37.1 03/03/2018 1458   PLT 273 03/03/2018 1458   MCV 85 03/03/2018 1458   MCH 28.5 03/03/2018 1458   MCH 25.1 (L) 11/16/2016 1710   MCHC 33.4 03/03/2018 1458   MCHC 33.1 11/16/2016 1710   RDW 16.7 (H) 03/03/2018 1458   LYMPHSABS 1.7 03/03/2018 1458   MONOABS 0.5 11/16/2016 1710   EOSABS 0.1 03/03/2018 1458   BASOSABS 0.0 03/03/2018 1458   Results for orders placed or performed in visit on 10/27/18  POCT urine pregnancy  Result Value Ref Range   Preg Test, Ur Negative Negative    ASSESSMENT AND PLAN: 1. Family planning education, guidance, and counseling We discussed options for contraception including Depo shot, IUD, Implanon, birth control pills and rings.  Discussed the pluses and minuses of these methods.  She feels the IUD would be a good fit for her.  I told her that we do not offer it here but we can either refer her to the gynecologist or she can check with her local health department.  She prefers to check with the health department first and if it is not available there she will call back for us to refer to gynecology. - POCT urine pregnancy   Patient was given the opportunity to ask questions.  Patient verbalized understanding of the plan and was able to repeat key elements of the plan.   Orders Placed This Encounter  Procedures  . POCT urine pregnancy     Requested Prescriptions    No prescriptions requested or ordered in this encounter    Return if symptoms worsen or fail to improve.  Jonah Blueeborah Annikah Lovins, MD, FACP

## 2018-10-27 NOTE — Patient Instructions (Signed)
You can call the Health Department in your county and inquire whether they place IUDs in their [facility].  If they do not, we can refer to gynecology for you to have this done.

## 2018-10-27 NOTE — Progress Notes (Signed)
patient would like to discuss changing her contraception. current Rx caused a rash that cleared up when she stopped taking. Is interested in having IUD placed.

## 2018-10-27 NOTE — Telephone Encounter (Signed)
Patient wants an IUD, per Nunzio Cory for nurse to complete check list prior to scheduling.

## 2018-10-27 NOTE — Telephone Encounter (Signed)
Phone call returned to patient. Patient requesting IUD insertion appt for [facility] IUD. IUD consult completed. Patient requesting an 11/12/2018 appt. Patient instructed to abstain from sex until scheduled appt for 11/12/2018 @ 10:00. Instruted patient to arrive at 9:40 for check in.  Hal Morales, RN

## 2018-11-12 ENCOUNTER — Ambulatory Visit: Payer: Medicaid Other

## 2018-11-13 ENCOUNTER — Encounter: Payer: Self-pay | Admitting: Nurse Practitioner

## 2018-11-13 ENCOUNTER — Ambulatory Visit (LOCAL_COMMUNITY_HEALTH_CENTER): Payer: Medicaid Other | Admitting: Nurse Practitioner

## 2018-11-13 ENCOUNTER — Ambulatory Visit: Payer: Medicaid Other

## 2018-11-13 ENCOUNTER — Other Ambulatory Visit: Payer: Self-pay

## 2018-11-13 VITALS — BP 115/75 | Ht 62.0 in | Wt 134.2 lb

## 2018-11-13 DIAGNOSIS — Z30430 Encounter for insertion of intrauterine contraceptive device: Secondary | ICD-10-CM

## 2018-11-13 DIAGNOSIS — Z3009 Encounter for other general counseling and advice on contraception: Secondary | ICD-10-CM

## 2018-11-13 DIAGNOSIS — B9689 Other specified bacterial agents as the cause of diseases classified elsewhere: Secondary | ICD-10-CM

## 2018-11-13 DIAGNOSIS — N760 Acute vaginitis: Secondary | ICD-10-CM | POA: Diagnosis not present

## 2018-11-13 DIAGNOSIS — Z113 Encounter for screening for infections with a predominantly sexual mode of transmission: Secondary | ICD-10-CM

## 2018-11-13 LAB — WET PREP FOR TRICH, YEAST, CLUE
Trichomonas Exam: NEGATIVE
Yeast Exam: NEGATIVE

## 2018-11-13 MED ORDER — METRONIDAZOLE 500 MG PO TABS: 500.0000 mg | ORAL_TABLET | Freq: Two times a day (BID) | ORAL | 0 refills | Status: DC

## 2018-11-13 MED ORDER — LEVONORGESTREL 13.5 MG IU IUD
13.5000 mg | INTRAUTERINE_SYSTEM | Freq: Once | INTRAUTERINE | Status: AC
  Administered 2018-11-13: 13.5 mg via INTRAUTERINE

## 2018-11-13 MED ORDER — THERA VITAL M PO TABS: 1.0000 | ORAL_TABLET | Freq: Every day | ORAL | 0 refills | Status: AC

## 2018-11-13 NOTE — Progress Notes (Signed)
Family Planning Visit - STD testing and IUD insertion  Subjective:  Deanna Ward is a 18 y.o. being seen today for an well woman visit and to discuss family planning options.    She is currently using none for pregnancy prevention. Patient reports she does not if she or her partner wants a pregnancy in the next year. Patient  has Menorrhagia with irregular cycle; Excessive or frequent menstruation; Iron deficiency anemia; Rash and nonspecific skin eruption; and Seasonal allergies on their problem list.  Chief Complaint  Patient presents with  . Contraception    Patient reports - no concerns or questions at this time  Patient denies  - any history of blood clots or migraine headaches   Does the patient desire a pregnancy in the next year? (OKQ flowsheet)  See flowsheet for other program required questions.   Body mass index is 24.55 kg/m. - Patient is eligible for diabetes screening based on BMI and age >40?  no HA1C ordered? no  Patient reports 1 of partners in last year. Desires STI screening?  Yes  Does the patient have a current or past history of drug use? No   No components found for: HCV]   Health Maintenance Due  Topic Date Due  . HIV Screening  10/09/2013  . INFLUENZA VACCINE  10/31/2018    ROS  The following portions of the patient's history were reviewed and updated as appropriate: allergies, current medications, past family history, past medical history, past social history, past surgical history and problem list. Problem list updated.  Objective:   Vitals:   11/13/18 1100  BP: 115/75  Weight: 134 lb 3.2 oz (60.9 kg)  Height: 5\' 2"  (1.575 m)    Physical Exam    Assessment and Plan:  Deanna Ward is a 18 y.o. female presenting to the [facility] Department for an initial well woman exam/family planning visit  Contraception counseling: Reviewed all forms of birth control options available including abstinence; over the  counter/barrier methods; hormonal contraceptive medication including pill, patch, ring, injection,contraceptive implant; hormonal and nonhormonal IUDs; permanent sterilization options including vasectomy and the various tubal sterilization modalities. Risks and benefits reviewed.  Questions were answered.  Written information was also given to the patient to review.  Patient desires IUD insertion, this was prescribed for patient. She will follow up in  1 year for surveillance.  She was told to call with any further questions, or with any concerns about this method of contraception.  Emphasized use of condoms 100% of the time for STI prevention.  1. Encounter for IUD insertion  - Multiple Vitamins-Minerals (MULTIVITAMIN) tablet; Take 1 tablet by mouth daily.  Dispense: 100 tablet; Refill: 0 - Levonorgestrel IUD 13.5 mg  Patient presented to [facility] for IUD insertion. Her GC/CT screening was found to be up to date and using WHO criteria we can be reasonably certain she is not pregnant.  See Flowsheet for IUD check list  IUD Insertion Procedure Note Patient identified, informed consent performed, consent signed.   Discussed risks of irregular bleeding, cramping, infection, malpositioning or misplacement of the IUD outside the uterus which may require further procedure such as laparoscopy. Time out was performed.  Urine pregnancy test N/A. LMP - 11/06/2018  Speculum placed in the vagina.  Cervix visualized.  Cleaned with Betadine x 2.  10 and 2 o'clock Grasped anteriorly with a single tooth tenaculum.  Uterus sounded to 6.5 cm.  IUD placed per manufacturer's recommendations.  Strings trimmed to  3 cm. Tenaculum was removed, good hemostasis noted.  Patient tolerated procedure well.   Patient was given post-procedure instructions.  She was advised to have backup contraception for one week.  Patient was also asked to check IUD strings periodically or follow up in 4 weeks for IUD check.  2. Screening for STD  (sexually transmitted disease) Await results  - HIV [place] [facility] - Syphilis Serology, [place] [facility] - Chlamydia/Gonorrhea [place] [facility] - WET PREP FOR TRICH, YEAST, CLUE  3. Bacterial vaginosis  Please give  - metroNIDAZOLE (FLAGYL) 500 MG tablet; Take 1 tablet (500 mg total) by mouth 2 (two) times daily.  Dispense: 14 tablet; Refill: 0 Per standing order  4. Family planning services  - Multiple Vitamins-Minerals (MULTIVITAMIN) tablet; Take 1 tablet by mouth daily.  Dispense: 100 tablet; Refill: 0 - Levonorgestrel IUD 13.5 mg   Client verbalizes understanding and is in agreement with plan of care    Return in about 1 year (around 11/13/2019) for Yearly physical exam, PRN.  No future appointments.  Donn PieriniKarla W Leath, NP

## 2018-11-13 NOTE — Progress Notes (Signed)
In for visit; desires Skyla IUD and STD screening Debera Lat, RN +BV-informed will have Rx sent to pharmacy Debera Lat, RN

## 2018-11-13 NOTE — Patient Instructions (Signed)

## 2018-11-14 ENCOUNTER — Encounter: Payer: Medicaid Other | Admitting: Obstetrics and Gynecology

## 2018-11-27 ENCOUNTER — Encounter: Payer: Self-pay | Admitting: Nurse Practitioner

## 2018-11-27 ENCOUNTER — Other Ambulatory Visit: Payer: Self-pay

## 2018-11-27 ENCOUNTER — Ambulatory Visit (LOCAL_COMMUNITY_HEALTH_CENTER): Payer: Medicaid Other | Admitting: Nurse Practitioner

## 2018-11-27 DIAGNOSIS — T8332XA Displacement of intrauterine contraceptive device, initial encounter: Secondary | ICD-10-CM | POA: Diagnosis not present

## 2018-11-27 DIAGNOSIS — Z3009 Encounter for other general counseling and advice on contraception: Secondary | ICD-10-CM

## 2018-11-27 NOTE — Telephone Encounter (Signed)
Called patient. Patient states she thinks a string from the IUD came out but she did not see the device itself. Would like an appointment with Jerline Pain for pelvic/IUD checked. Appt scheduled for this afternoon.

## 2018-11-27 NOTE — Progress Notes (Signed)
Here for IUD check - IUD strings visualized and palpated   [facility] problem visit  [place] Department  Subjective:  Deanna Ward is a 18 y.o. being seen today for   Chief Complaint  Patient presents with  . Gynecologic Exam    IUD check - unsure    Client into clinic - thought she saw IUD "string" in toilet Wants to make sure device still in place    Does the patient have a current or past history of drug use? No   No components found for: HCV]   Health Maintenance Due  Topic Date Due  . HIV Screening  10/09/2013  . INFLUENZA VACCINE  10/31/2018    Review of Systems  All other systems reviewed and are negative.   The following portions of the patient's history were reviewed and updated as appropriate: allergies, current medications, past family history, past medical history, past social history, past surgical history and problem list. Problem list updated.   See flowsheet for other program required questions.  Objective:  There were no vitals filed for this visit.  Physical Exam Vitals signs reviewed.  Constitutional:      Appearance: Normal appearance. She is well-developed and normal weight.  Pulmonary:     Effort: Pulmonary effort is normal.  Genitourinary:    Exam position: Lithotomy position.     Vagina: Normal.     Cervix: Normal.     Comments: IUD strings visualized Palpated as well Skin:    General: Skin is warm and dry.  Neurological:     Mental Status: She is alert.  Psychiatric:        Behavior: Behavior is cooperative.       Assessment and Plan:  Deanna Ward is a 18 y.o. female presenting to the [facility] Department for a Women's Health problem visit  1. Intrauterine contraceptive device check  IUD strings visualized and palpated - discussed at length with client about how to check strings and what to do if partner "feels" them No further action required at this time Client  verbalizes understanding and is in agreement with plan of care      Return for PRN.  No future appointments.  Berniece Andreas, NP

## 2018-11-30 ENCOUNTER — Encounter: Payer: Self-pay | Admitting: Nurse Practitioner

## 2018-12-14 ENCOUNTER — Other Ambulatory Visit: Payer: Self-pay | Admitting: *Deleted

## 2018-12-14 DIAGNOSIS — Z20822 Contact with and (suspected) exposure to covid-19: Secondary | ICD-10-CM

## 2018-12-15 LAB — NOVEL CORONAVIRUS, NAA: SARS-CoV-2, NAA: DETECTED — AB

## 2018-12-22 ENCOUNTER — Other Ambulatory Visit: Payer: Self-pay

## 2018-12-22 DIAGNOSIS — Z20822 Contact with and (suspected) exposure to covid-19: Secondary | ICD-10-CM

## 2018-12-23 LAB — NOVEL CORONAVIRUS, NAA: SARS-CoV-2, NAA: NOT DETECTED

## 2018-12-25 ENCOUNTER — Other Ambulatory Visit: Payer: Self-pay

## 2018-12-25 DIAGNOSIS — Z20822 Contact with and (suspected) exposure to covid-19: Secondary | ICD-10-CM

## 2018-12-26 LAB — NOVEL CORONAVIRUS, NAA: SARS-CoV-2, NAA: NOT DETECTED

## 2019-02-02 ENCOUNTER — Telehealth: Payer: Self-pay | Admitting: Family Medicine

## 2019-02-02 NOTE — Telephone Encounter (Signed)
wants IUD removed ASAP

## 2019-02-02 NOTE — Telephone Encounter (Signed)
TC with patient. Requests IUD removal d/t bleeding with sex everytime.  Reports can feel strings and had no pain, just bleeding. Scheduled appt however informed patient that provider will likely assess for other potential causes of bleeding. Patient verbalized understanding Aileen Fass, RN

## 2019-02-05 ENCOUNTER — Ambulatory Visit (LOCAL_COMMUNITY_HEALTH_CENTER): Payer: Medicaid Other | Admitting: Advanced Practice Midwife

## 2019-02-05 ENCOUNTER — Encounter: Payer: Self-pay | Admitting: Advanced Practice Midwife

## 2019-02-05 ENCOUNTER — Other Ambulatory Visit: Payer: Self-pay

## 2019-02-05 VITALS — BP 116/64 | Ht 62.0 in | Wt 136.0 lb

## 2019-02-05 DIAGNOSIS — Z3009 Encounter for other general counseling and advice on contraception: Secondary | ICD-10-CM

## 2019-02-05 DIAGNOSIS — Z113 Encounter for screening for infections with a predominantly sexual mode of transmission: Secondary | ICD-10-CM | POA: Diagnosis not present

## 2019-02-05 DIAGNOSIS — Z30432 Encounter for removal of intrauterine contraceptive device: Secondary | ICD-10-CM | POA: Diagnosis not present

## 2019-02-05 DIAGNOSIS — Z30011 Encounter for initial prescription of contraceptive pills: Secondary | ICD-10-CM

## 2019-02-05 LAB — WET PREP FOR TRICH, YEAST, CLUE
Trichomonas Exam: NEGATIVE
Yeast Exam: NEGATIVE

## 2019-02-05 MED ORDER — NORGESTIM-ETH ESTRAD TRIPHASIC 0.18/0.215/0.25 MG-25 MCG PO TABS: 1.0000 | ORAL_TABLET | Freq: Every day | ORAL | 0 refills | Status: DC

## 2019-02-05 NOTE — Progress Notes (Signed)
Pt here for IUD removal. Pt has the skyla that was placed 11/13/2018. Pt reports since then she has had frequent menstraul bleeding and spotting. Reports only has had a week without bleeding or spotting. Pt also reports abdominal pain and bleeding after sex. Pt reports she was on birth control pills but unsure of type but had itching and rash with it on chest and neck. Pt wants to get on birth control pills. Pt desires blood work today for HIV and syphillis.Ronny Bacon, RN

## 2019-02-05 NOTE — Progress Notes (Signed)
Wet mount reviewed. No tx per SO. Verified pharmacy in EMR; [facility]. Aileen Fass, RN

## 2019-02-05 NOTE — Progress Notes (Signed)
[facility]  Subjective:  Deanna Ward is a 18 y.o. SHF G1P0 nonsmoker being seen today for Skyla removal (inserted 11/13/18) and ocp initiation  Chief Complaint  Patient presents with  . Contraception    IUD removal    HPI  C/o increased bleeding/spotting daily with occassional abdominal pain and bleeding after sex.  Last sex 01/24/19 without condom.  With current partner since 08/2018 1 partner.  Counseled on 4-5 mo post insertion of irregular bleeding/spotting is normal.  Pt wants Skyla removed today Does the patient have a current or past history of drug use? No   No components found for: HCV]   Health Maintenance Due  Topic Date Due  . HIV Screening  10/09/2013  . INFLUENZA VACCINE  10/31/2018    ROS  The following portions of the patient's history were reviewed and updated as appropriate: allergies, current medications, past family history, past medical history, past social history, past surgical history and problem list. Problem list updated.   See flowsheet for other program required questions.  Objective:   Vitals:   02/05/19 0912  BP: 116/64  Weight: 136 lb (61.7 kg)  Height: 5\' 2"  (1.575 m)    Physical Exam Constitutional:      Appearance: Normal appearance. She is normal weight.  Eyes:     Conjunctiva/sclera: Conjunctivae normal.  Pulmonary:     Effort: Pulmonary effort is normal.     Breath sounds: Normal breath sounds.  Abdominal:     Palpations: Abdomen is soft.     Tenderness: There is no abdominal tenderness. There is no guarding.  Genitourinary:    General: Normal vulva.     Exam position: Lithotomy position.     Labia:        Right: No lesion.        Left: No lesion.      Vagina: Bleeding (red menses blood, ph>4.5) present.     Cervix: Normal.     Uterus: Normal.      Adnexa: Left adnexa normal.     Rectum: Normal.  Skin:    General: Skin is dry.  Neurological:      Mental Status: She is alert.       Assessment and Plan:  Deanna Ward is a 18 y.o. female presenting to the [facility] Department for a Women's Health problem visit  1. Encounter for IUD removal Skyla easily removed and shown to pt   IUD Removal  Patient identified, informed consent performed, consent signed.  Patient was in the dorsal lithotomy position, normal external genitalia was noted.  A speculum was placed in the patient's vagina, normal discharge was noted, no lesions. The cervix was visualized, no lesions, no abnormal discharge.  The strings of the IUD were grasped and pulled using ring forceps. The IUD was removed in its entirety.   Patient tolerated the procedure well.    Patient will use ocp's for contraception plans for pregnancy soon and she was told to avoid teratogens, take PNV and folic acid.  Routine preventative health maintenance measures emphasized.   2. Screening examination for venereal disease Treat wet mount per standing orders Immunization nurse consult - WET PREP FOR TRICH, YEAST, CLUE - Syphilis Serology, [place] [facility] - HIV [place] [facility] - Chlamydia/Gonorrhea [facility]  3. Family planning Wants ocp's  4. Encounter for initial prescription of contraceptive pills Please put pharmacy into Epic and pt wants  OTC-LO #3 I po daily to begin today Please counsel on need for backup condoms/abstinance next 7 days Pt counseled her body needs period of adjustment of 3-4 mo to ocp's     Return in about 3 months (around 05/08/2019).  No future appointments.  Herbie Saxon, CNM

## 2019-03-30 ENCOUNTER — Other Ambulatory Visit: Payer: Self-pay

## 2019-03-30 ENCOUNTER — Emergency Department
Admission: EM | Admit: 2019-03-30 | Discharge: 2019-03-30 | Disposition: A | Discharge status: Home / Self Care | Payer: Medicaid Other | Attending: Emergency Medicine | Admitting: Emergency Medicine

## 2019-03-30 ENCOUNTER — Encounter: Payer: Self-pay | Admitting: Emergency Medicine

## 2019-03-30 ENCOUNTER — Emergency Department: Payer: Medicaid Other

## 2019-03-30 DIAGNOSIS — Z793 Long term (current) use of hormonal contraceptives: Secondary | ICD-10-CM | POA: Insufficient documentation

## 2019-03-30 DIAGNOSIS — R102 Pelvic and perineal pain: Secondary | ICD-10-CM | POA: Diagnosis not present

## 2019-03-30 DIAGNOSIS — N939 Abnormal uterine and vaginal bleeding, unspecified: Secondary | ICD-10-CM | POA: Insufficient documentation

## 2019-03-30 DIAGNOSIS — Z79899 Other long term (current) drug therapy: Secondary | ICD-10-CM | POA: Diagnosis not present

## 2019-03-30 LAB — CBC WITH DIFFERENTIAL/PLATELET
Abs Immature Granulocytes: 0.01 10*3/uL (ref 0.00–0.07)
Basophils Absolute: 0 10*3/uL (ref 0.0–0.1)
Basophils Relative: 0 %
Eosinophils Absolute: 0.2 10*3/uL (ref 0.0–0.5)
Eosinophils Relative: 3 %
HCT: 36.4 % (ref 36.0–46.0)
Hemoglobin: 12.5 g/dL (ref 12.0–15.0)
Immature Granulocytes: 0 %
Lymphocytes Relative: 24 %
Lymphs Abs: 1.7 10*3/uL (ref 0.7–4.0)
MCH: 30.3 pg (ref 26.0–34.0)
MCHC: 34.3 g/dL (ref 30.0–36.0)
MCV: 88.3 fL (ref 80.0–100.0)
Monocytes Absolute: 0.4 10*3/uL (ref 0.1–1.0)
Monocytes Relative: 6 %
Neutro Abs: 4.7 10*3/uL (ref 1.7–7.7)
Neutrophils Relative %: 67 %
Platelets: 247 10*3/uL (ref 150–400)
RBC: 4.12 MIL/uL (ref 3.87–5.11)
RDW: 11.9 % (ref 11.5–15.5)
WBC: 7 10*3/uL (ref 4.0–10.5)
nRBC: 0 % (ref 0.0–0.2)

## 2019-03-30 LAB — WET PREP, GENITAL
Clue Cells Wet Prep HPF POC: NONE SEEN
Sperm: NONE SEEN
Trich, Wet Prep: NONE SEEN
Yeast Wet Prep HPF POC: NONE SEEN

## 2019-03-30 LAB — BASIC METABOLIC PANEL
Anion gap: 7 (ref 5–15)
BUN: 11 mg/dL (ref 6–20)
CO2: 26 mmol/L (ref 22–32)
Calcium: 9.3 mg/dL (ref 8.9–10.3)
Chloride: 105 mmol/L (ref 98–111)
Creatinine, Ser: 0.46 mg/dL (ref 0.44–1.00)
GFR calc Af Amer: >60 mL/min (ref >60)
GFR calc non Af Amer: >60 mL/min (ref >60)
Glucose, Bld: 96 mg/dL (ref 70–99)
Potassium: 3.6 mmol/L (ref 3.5–5.1)
Sodium: 138 mmol/L (ref 135–145)

## 2019-03-30 LAB — POCT PREGNANCY, URINE: Preg Test, Ur: NEGATIVE

## 2019-03-30 MED ORDER — FERROUS SULFATE 325 (65 FE) MG PO TBEC: 325.0000 mg | DELAYED_RELEASE_TABLET | Freq: Every day | ORAL | 0 refills | Status: DC

## 2019-03-30 MED ORDER — IBUPROFEN 600 MG PO TABS: 600.0000 mg | ORAL_TABLET | Freq: Three times a day (TID) | ORAL | 0 refills | Status: AC

## 2019-03-30 NOTE — ED Notes (Signed)
Patient transported to Ultrasound 

## 2019-03-30 NOTE — ED Triage Notes (Signed)
Pt presents to ED via POV with, states last week ahd menstrual cycle that lasted approx 3 days and was light, states after 3-4 days pt had bleeding start again, this time noted to be heavier than before with more blood clots. Pt states she performed self digital vaginal exam and "felt a ball" and she doesn't know what it is.  Pt states using 1 pad every 2 hrs. Pt c/o pain to her cervix, reports yesterday that she felt a "pinching" in her cervix.

## 2019-03-30 NOTE — Discharge Instructions (Addendum)
For now, start taking the Ibuprofen 600 mg every 8 hours for 5 days. This will help with bleeding and cramping.  Start an iron supplement. I've prescribed one but you can also take an over-the-counter one if available or preferred.  Continue your current cycle of OCPs. Your bleeding should stop within the first few days of taking the new pack.

## 2019-03-30 NOTE — ED Provider Notes (Signed)
[facility] Emergency Department Provider Note  ____________________________________________   First MD Initiated Contact with Patient 03/30/19 1312     (approximate)  I have reviewed the triage vital signs and the nursing notes.   HISTORY  Chief Complaint Vaginal Bleeding    HPI Deanna Ward is a 18 y.o. female  Here with vaginal bleeding. Pt states that starting Dec 18th, she begn having what she describes as a normal, light, 3-4 day period. She was due for her period at this time. The week of Christmas, however, she began bleeding again and has since had ongoing, significant vaginal bleeding. She has been passing small clots as well and has had some associated intermittent sharp, stabbing LUQ pain. She has a h/o heavy periods when not on OCPs, and did recently swithc back to oral OCPs after trialing Mirena (switched back in Aug or Sep) due to vaginal bleeding. No urinary sx. No dyspareunia or high risk sexual activity.  Has been bleeding through multiple tampons and pads daily.       History reviewed. No pertinent past medical history.  Patient Active Problem List   Diagnosis Date Noted  . Rash and nonspecific skin eruption 06/10/2018  . Seasonal allergies 06/10/2018  . Menorrhagia with irregular cycle 03/03/2018  . Excessive or frequent menstruation 03/03/2018  . Iron deficiency anemia 03/03/2018    History reviewed. No pertinent surgical history.  Prior to Admission medications   Medication Sig Start Date End Date Taking? Authorizing Provider  ferrous sulfate 325 (65 FE) MG EC tablet Take 1 tablet (325 mg total) by mouth daily with breakfast. 03/30/19 04/29/19  Shaune Pollack, MD  fluticasone (FLONASE) 50 MCG/ACT nasal spray Place 2 sprays into both nostrils daily. 06/10/18   [name], FNP  ibuprofen (ADVIL) 600 MG tablet Take 1 tablet (600 mg total) by mouth 3 (three) times daily for 5 days. 03/30/19 04/04/19  Shaune Pollack, MD   metroNIDAZOLE (FLAGYL) 500 MG tablet Take 1 tablet (500 mg total) by mouth 2 (two) times daily. Patient not taking: Reported on 02/05/2019 11/13/18   Tessa Lerner, NP  Norgestimate-Ethinyl Estradiol Triphasic 0.18/0.215/0.25 MG-25 MCG tab Take 1 tablet by mouth daily. 02/05/19   Alberteen Spindle, CNM    Allergies Patient has no known allergies.  Family History  Problem Relation Age of Onset  . Healthy Mother   . Healthy Father   . Healthy Sister   . Healthy Sister   . Healthy Sister   . Stroke Neg Hx   . Heart disease Neg Hx   . Cancer Neg Hx     Social History Social History   Tobacco Use  . Smoking status: Never Smoker  . Smokeless tobacco: Never Used  Substance Use Topics  . Alcohol use: No  . Drug use: No    Review of Systems  Review of Systems  Constitutional: Positive for fatigue. Negative for fever.  HENT: Negative for congestion and sore throat.   Eyes: Negative for visual disturbance.  Respiratory: Negative for cough and shortness of breath.   Cardiovascular: Negative for chest pain.  Gastrointestinal: Negative for abdominal pain, diarrhea, nausea and vomiting.  Genitourinary: Positive for pelvic pain, vaginal bleeding and vaginal pain. Negative for flank pain.  Musculoskeletal: Negative for back pain and neck pain.  Skin: Negative for rash and wound.  Neurological: Negative for weakness.  All other systems reviewed and are negative.    ____________________________________________  PHYSICAL EXAM:      VITAL  SIGNS: ED Triage Vitals  Enc Vitals Group     BP 03/30/19 1233 139/90     Pulse Rate 03/30/19 1233 (!) 110     Resp 03/30/19 1233 16     Temp 03/30/19 1233 98.8 F (37.1 C)     Temp Source 03/30/19 1233 Oral     SpO2 03/30/19 1233 99 %     Weight 03/30/19 1231 135 lb (61.2 kg)     Height 03/30/19 1231 5\' 2"  (1.575 m)     Head Circumference --      Peak Flow --      Pain Score 03/30/19 1244 6     Pain Loc --      Pain Edu? --       Excl. in [place]? --      Physical Exam Vitals and nursing note reviewed.  Constitutional:      General: She is not in acute distress.    Appearance: She is well-developed.  HENT:     Head: Normocephalic and atraumatic.  Eyes:     Conjunctiva/sclera: Conjunctivae normal.  Cardiovascular:     Rate and Rhythm: Normal rate and regular rhythm.     Heart sounds: Normal heart sounds.  Pulmonary:     Effort: Pulmonary effort is normal. No respiratory distress.     Breath sounds: No wheezing.  Abdominal:     General: Abdomen is flat. There is no distension.     Tenderness: There is abdominal tenderness (mild suprapubic). There is no guarding or rebound.     Hernia: No hernia is present.  Genitourinary:    Comments: Moderate volume dark red blood in vaginal vault, Os closed. No adnexal pain or fullness. No CMT. Musculoskeletal:     Cervical back: Neck supple.  Skin:    General: Skin is warm.     Capillary Refill: Capillary refill takes less than 2 seconds.     Findings: No rash.  Neurological:     Mental Status: She is alert and oriented to person, place, and time.     Motor: No abnormal muscle tone.       ____________________________________________   LABS (all labs ordered are listed, but only abnormal results are displayed)  Labs Reviewed  WET PREP, GENITAL - Abnormal; Notable for the following components:      Result Value   WBC, Wet Prep HPF POC FEW (*)    All other components within normal limits  GC/CHLAMYDIA PROBE AMP  CBC WITH DIFFERENTIAL/PLATELET  BASIC METABOLIC PANEL  POC URINE PREG, ED  POCT PREGNANCY, URINE    ____________________________________________  EKG: None ________________________________________  RADIOLOGY All imaging, including plain films, CT scans, and ultrasounds, independently reviewed by me, and interpretations confirmed via formal radiology reads.  ED MD interpretation:   Pelvic U/S:  Official radiology report(s): US PELVIC COMPLETE  W TRANSVAGINAL AND TORSION R/O  Result Date: 03/30/2019 CLINICAL DATA:  18 year old female with left pelvic pain. EXAM: TRANSABDOMINAL AND TRANSVAGINAL ULTRASOUND OF PELVIS DOPPLER ULTRASOUND OF OVARIES TECHNIQUE: Both transabdominal and transvaginal ultrasound examinations of the pelvis were performed. Transabdominal technique was performed for global imaging of the pelvis including uterus, ovaries, adnexal regions, and pelvic cul-de-sac. It was necessary to proceed with endovaginal exam following the transabdominal exam to visualize the endometrium and ovaries. Color and duplex Doppler ultrasound was utilized to evaluate blood flow to the ovaries. COMPARISON:  None. FINDINGS: Uterus Measurements: 6.8 x 3.6 x 5.4 cm = volume: 69 mL. The uterus is anteverted and appears  unremarkable. Endometrium Thickness: 4 mm.  No focal abnormality visualized. Right ovary Measurements: 2.7 x 1.3 x 1.7 cm = volume: 3.1 mL. Normal appearance/no adnexal mass. Left ovary Measurements: 2.9 x 1.8 x 1.6 cm = volume: 4.2 mL. Normal appearance/no adnexal mass. Pulsed Doppler evaluation of both ovaries demonstrates normal low-resistance arterial and venous waveforms. Other findings Trace free fluid within the pelvis. IMPRESSION: Unremarkable pelvic ultrasound. Electronically Signed   By: Elgie CollardArash  Radparvar M.D.   On: 03/30/2019 14:39    ____________________________________________  PROCEDURES   Procedure(s) performed (including Critical Care):  Procedures  ____________________________________________  INITIAL IMPRESSION / MDM / ASSESSMENT AND PLAN / ED COURSE  As part of my medical decision making, I reviewed the following data within the electronic MEDICAL RECORD NUMBER Nursing notes reviewed and incorporated, Old chart reviewed, Notes from prior ED visits, and [place] Controlled Substance Database       *Liridona Vanetta MuldersReyes Urias was evaluated in Emergency Department on 03/30/2019 for the symptoms described in the history of present  illness. She was evaluated in the context of the global COVID-19 pandemic, which necessitated consideration that the patient might be at risk for infection with the SARS-CoV-2 virus that causes COVID-19. Institutional protocols and algorithms that pertain to the evaluation of patients at risk for COVID-19 are in a state of rapid change based on information released by regulatory bodies including the CDC and federal and state organizations. These policies and algorithms were followed during the patient's care in the ED.  Some ED evaluations and interventions may be delayed as a result of limited staffing during the pandemic.*     Medical Decision Making:  Very pleasant 18 yo F here with vaginal bleeding. Suspect DUB/anovulatory bleeding in setting of recent transition from IUD to OCP. No signs of large ovarian cyst or torsion. Hgb is stable. Labs o/w reassuring. Wet prep neg. No urinary sx. She is now on OCPs and she is in her last placebo week. Will have her continue this x 2 days then start her new pack which should help, with scheduled ibuprofen, iron supplements, and outpt follow-up.  ____________________________________________  FINAL CLINICAL IMPRESSION(S) / ED DIAGNOSES  Final diagnoses:  Adnexal pain  Vaginal bleeding     MEDICATIONS GIVEN DURING THIS VISIT:  Medications - No data to display   ED Discharge Orders         Ordered    ibuprofen (ADVIL) 600 MG tablet  3 times daily     03/30/19 1451    ferrous sulfate 325 (65 FE) MG EC tablet  Daily with breakfast     03/30/19 1451           Note:  This document was prepared using Dragon voice recognition software and may include unintentional dictation errors.   Shaune PollackIsaacs, Jeweldean Drohan, MD 03/30/19 1504

## 2019-03-31 LAB — GC/CHLAMYDIA PROBE AMP
Chlamydia trachomatis, NAA: NEGATIVE
Neisseria Gonorrhoeae by PCR: NEGATIVE

## 2019-04-22 ENCOUNTER — Other Ambulatory Visit: Payer: Self-pay | Admitting: Advanced Practice Midwife

## 2019-04-22 DIAGNOSIS — Z3041 Encounter for surveillance of contraceptive pills: Secondary | ICD-10-CM

## 2019-04-26 MED ORDER — NORGESTIM-ETH ESTRAD TRIPHASIC 0.18/0.215/0.25 MG-25 MCG PO TABS: 1.0000 | ORAL_TABLET | Freq: Every day | ORAL | 2 refills | Status: DC

## 2019-04-26 NOTE — Telephone Encounter (Signed)
Reviewed note from 01/2019.  At that time BP and other vitals were normal and patient told to notify us if had problem with OC.  No documentation in chart of patient with concerns.  Rx sent to pharmacy for 6 months of refills.

## 2019-12-30 ENCOUNTER — Other Ambulatory Visit: Payer: Medicaid Other

## 2020-01-31 IMAGING — US US PELVIS COMPLETE TRANSABD/TRANSVAG
1 series · 13 of 25 positions shown · non-contrast
Comparison: None

CLINICAL DATA: Ventral regularity.  Anemia.

EXAM:
TRANSABDOMINAL AND TRANSVAGINAL ULTRASOUND OF PELVIS
TECHNIQUE: Both transabdominal and transvaginal ultrasound examinations of the
pelvis were performed. Transabdominal technique was performed for
global imaging of the pelvis including uterus, ovaries, adnexal
regions, and pelvic cul-de-sac. It was necessary to proceed with
endovaginal exam following the transabdominal exam to visualize the
endometrium and ovaries.

[Series 1: us pelvis complete transabd/transvag · 0.22mm/px · 13 of 115 slices shown]
[im 1/115]
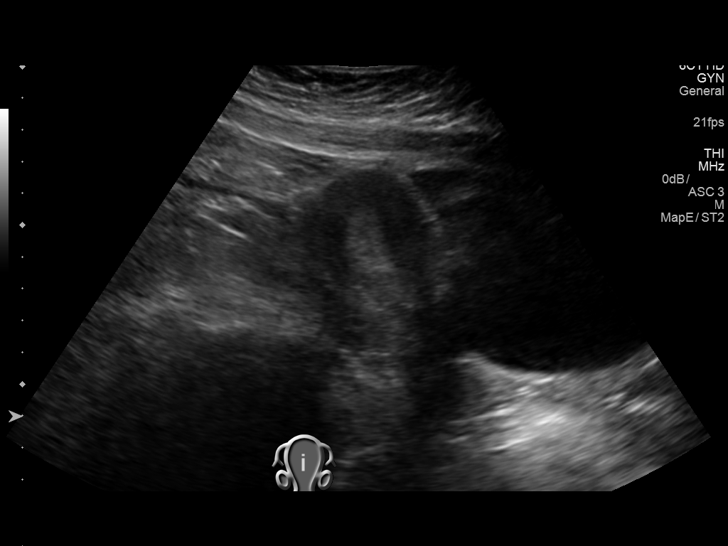
[im 10/115]
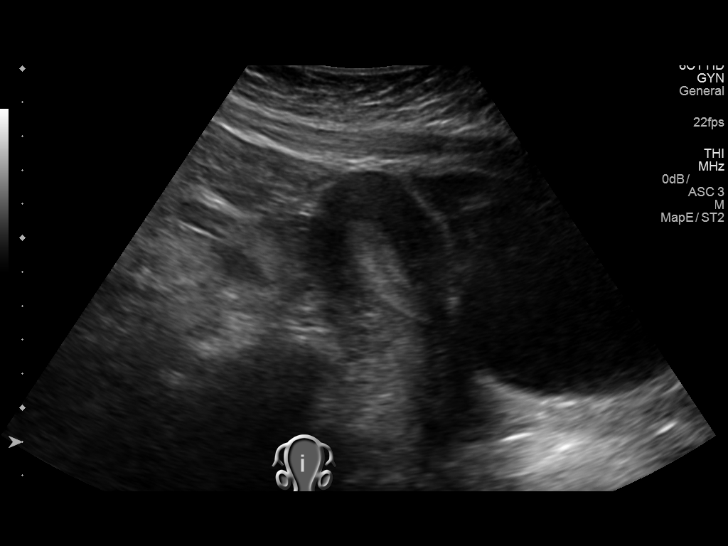
[im 20/115]
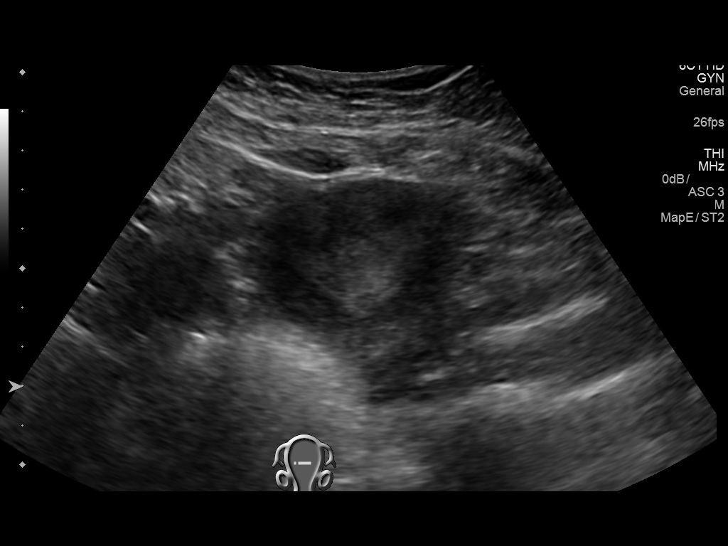
[im 29/115]
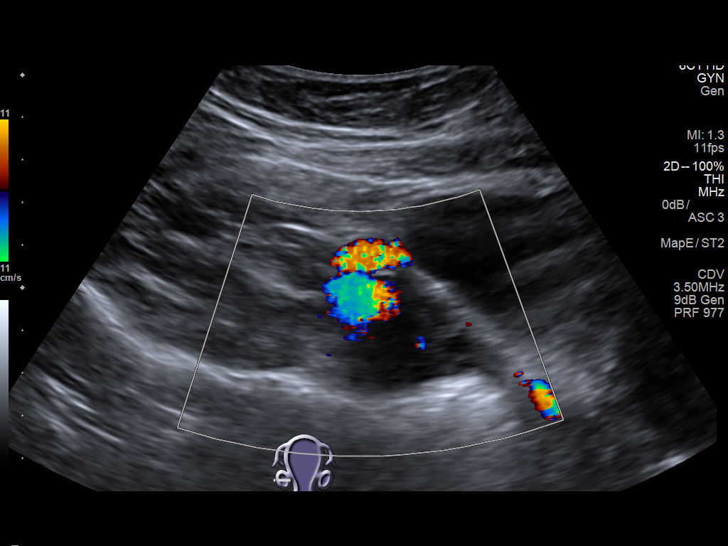
[im 39/115]
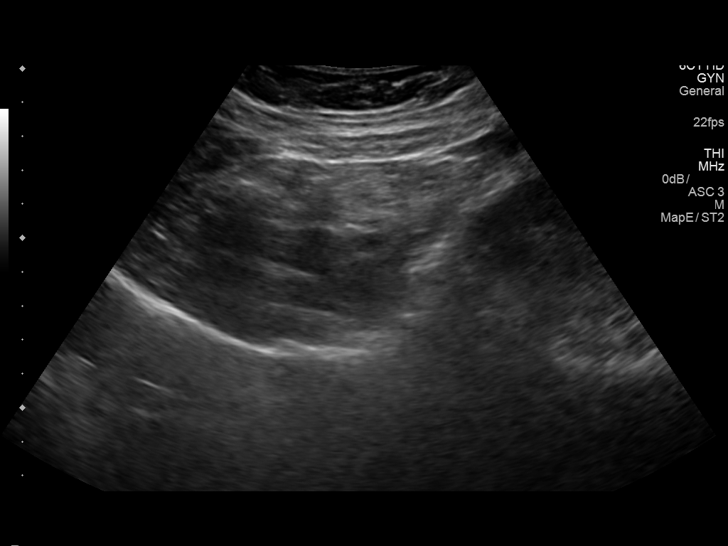
[im 48/115]
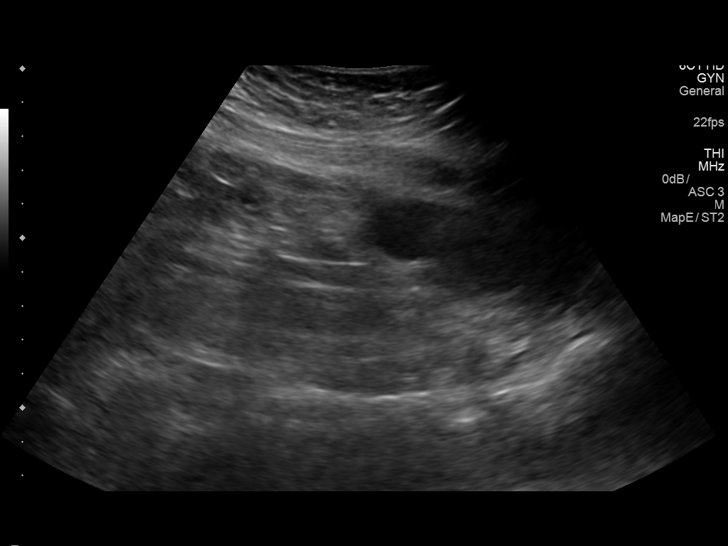
[im 58/115]
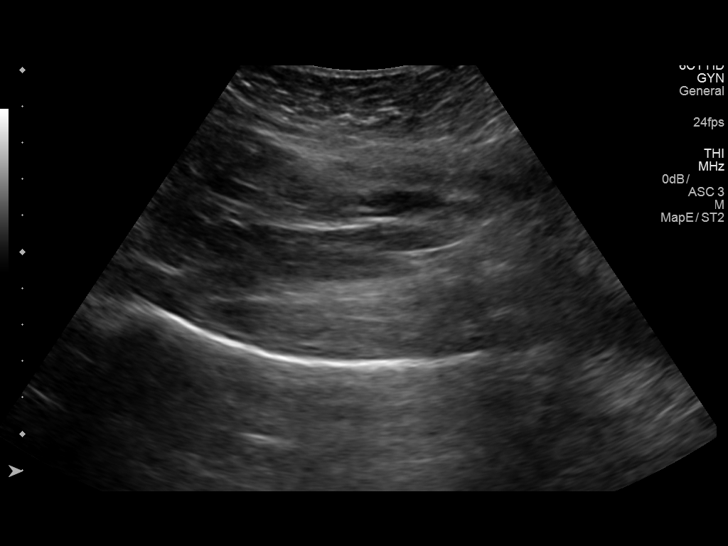
[im 67/115]
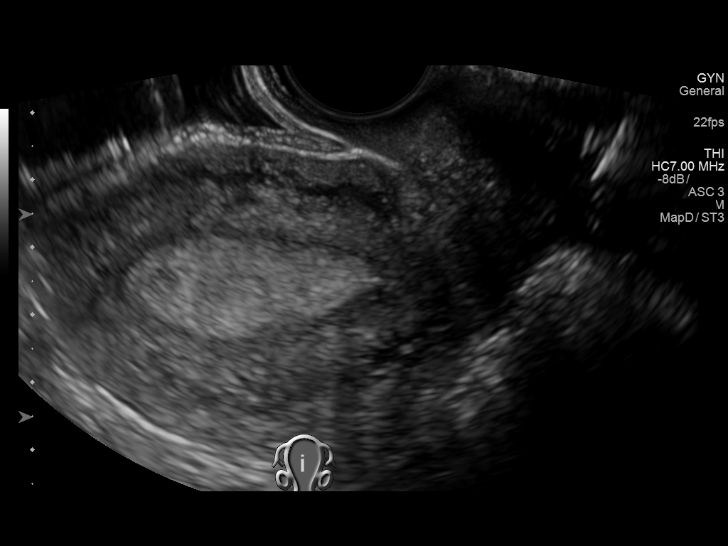
[im 77/115]
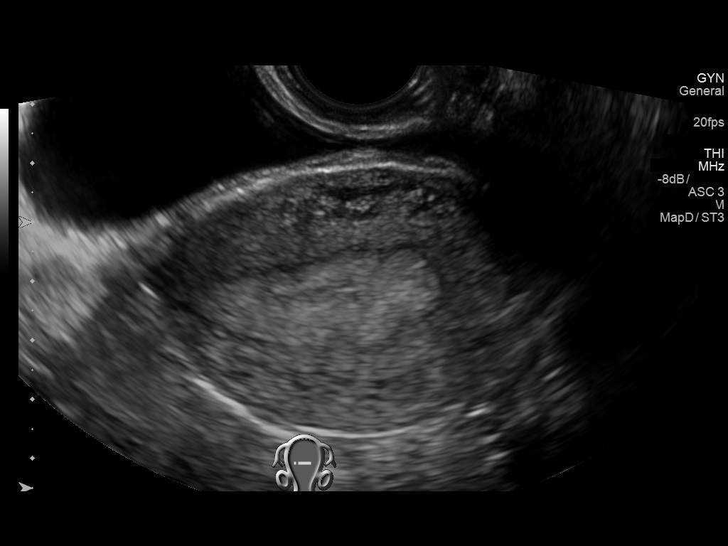
[im 86/115]
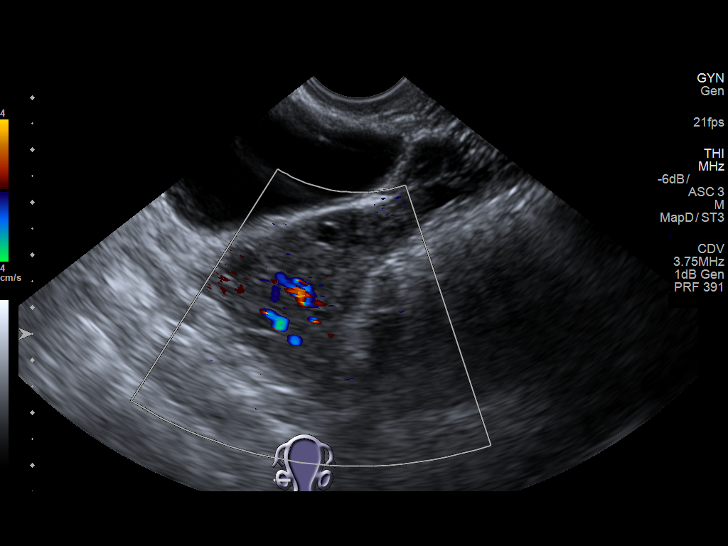
[im 96/115]
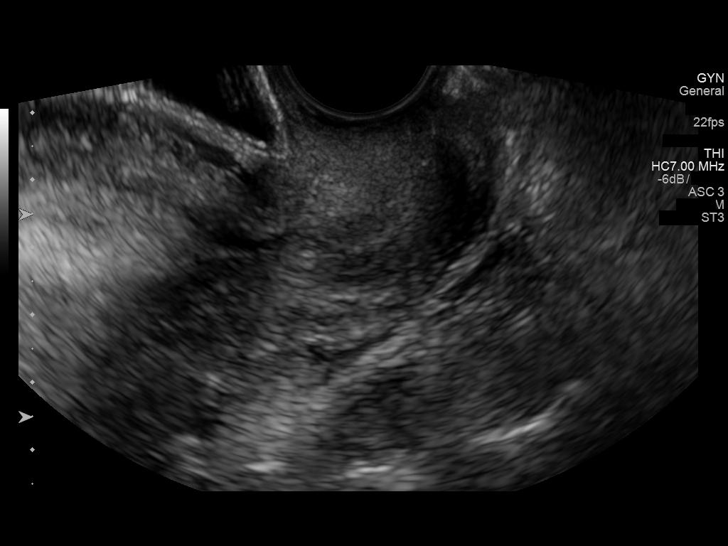
[im 105/115]
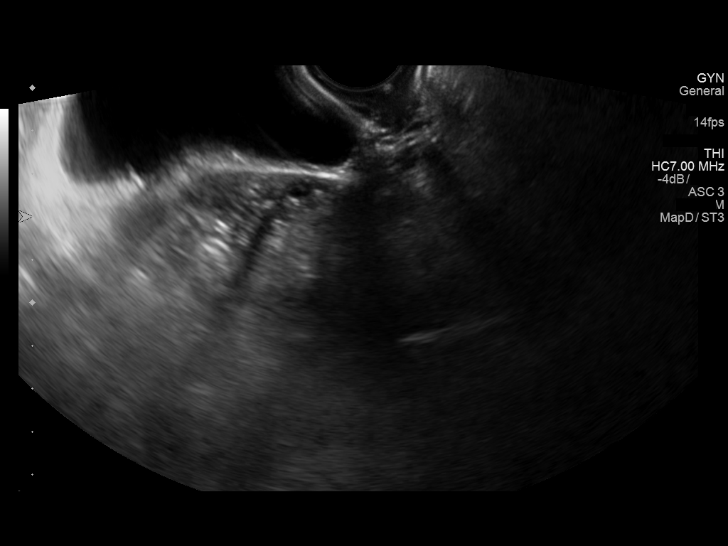
[im 115/115]
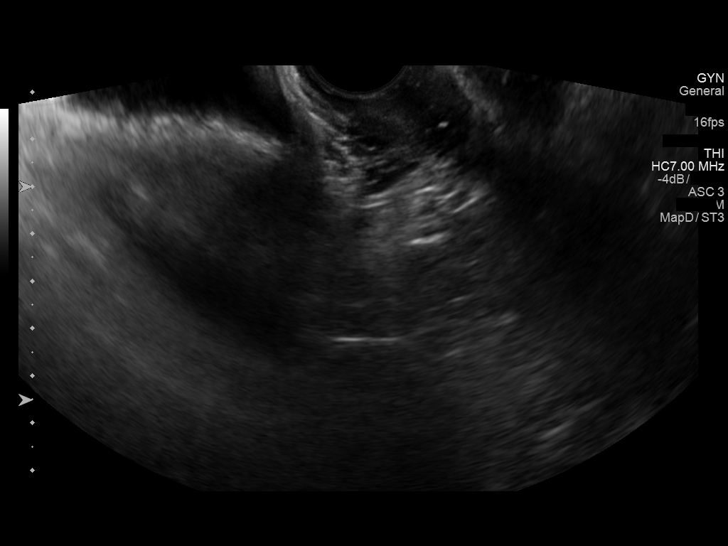

[13 of 25 positions shown; findings below may reference images not displayed]

FINDINGS: Uterus

Measurements: 8.4 x 4.0 x 5.5 cm = volume: 96 mL. No fibroids or
other mass visualized.

Endometrium

Thickness: 11.5 mm.  No focal abnormality visualized.

Right ovary

Measurements: 3.6 x 2.4 x 2.4 cm = volume: 11 mL. Normal
appearance/no adnexal mass.

Left ovary

Measurements: 2.5 x 1.5 x 0.9 cm = volume: 1.8 mL. Normal
appearance/no adnexal mass.

Other findings

Trace fluid in the cul-de-sac is likely physiologic.
IMPRESSION: 1. The endometrial thickness is normal for age. If bleeding remains
unresponsive to hormonal or medical therapy, sonohysterogram should
be considered for focal lesion work-up. (Ref: Radiological
Reasoning: Algorithmic Workup of Abnormal Vaginal Bleeding with
Endovaginal Sonography and Sonohysterography. [id number]; 191:S68-73)
2. Minimal physiologic fluid in the pelvis.
3. No other abnormalities.

## 2020-08-02 ENCOUNTER — Ambulatory Visit (LOCAL_COMMUNITY_HEALTH_CENTER): Payer: Medicaid Other | Admitting: Advanced Practice Midwife

## 2020-08-02 ENCOUNTER — Ambulatory Visit: Payer: Self-pay

## 2020-08-02 ENCOUNTER — Other Ambulatory Visit: Payer: Self-pay

## 2020-08-02 VITALS — BP 131/75 | Ht 62.0 in | Wt 135.0 lb

## 2020-08-02 DIAGNOSIS — Z3009 Encounter for other general counseling and advice on contraception: Secondary | ICD-10-CM | POA: Diagnosis not present

## 2020-08-02 DIAGNOSIS — Z01419 Encounter for gynecological examination (general) (routine) without abnormal findings: Secondary | ICD-10-CM

## 2020-08-02 LAB — WET PREP FOR TRICH, YEAST, CLUE: Trichomonas Exam: NEGATIVE

## 2020-08-02 LAB — PREGNANCY, URINE: Preg Test, Ur: NEGATIVE

## 2020-08-02 MED ORDER — LEVONORGESTREL 1.5 MG PO TABS
1.5000 mg | ORAL_TABLET | Freq: Once | ORAL | Status: AC
  Administered 2020-08-02: 1.5 mg via ORAL

## 2020-08-02 NOTE — Progress Notes (Signed)
Wet mount results reviewed with Provider.  No treatment needed.  Urine PT reviewed, Plan B consent form signed, medication and instructions given to pt. Berdie Ogren, RN

## 2020-08-02 NOTE — Progress Notes (Signed)
[facility] [address] Main Number: [phone]    Family Planning Visit- Initial Visit  Subjective:  Deanna Ward is a 20 y.o. SHF G1P0 nonsmoker   being seen today for an initial annual visit and to discuss contraceptive options.  The patient is currently using None for pregnancy prevention. Patient reports she does not know want a pregnancy in the next year.  Patient has the following medical conditions has Menorrhagia with irregular cycle; Excessive or frequent menstruation; Iron deficiency anemia; Rash and nonspecific skin eruption; and Seasonal allergies on their problem list.  Chief Complaint  Patient presents with  . Annual Exam    PE, Pap and STD check    Patient reports here for physical, pap, STD exam.  Declines birth control at this time. Skyla removed 02/05/2019 with physical, and given OTC-Lo then and on 04/22/19.  Pt ran out of ocp's and has not been using any birth control. Counseled on birth control today but pt wants to "think about it". LMP 07/14/20. Last sex 07/31/20 without condom; with current partner x 8 mo; 2 partners in last 3 mo. Employed 37.5 hours/wk and not in school. Living with mom, 2 sisters, 2 nephews. Last ETOH 07/28/20 (2 c. Liquor) q weekend.    Patient denies cigs, vaping, cigars, MJ   Body mass index is 24.69 kg/m. - Patient is eligible for diabetes screening based on BMI and age >13?  not applicable HA1C ordered? not applicable  Patient reports 2  partner/s in last year. Desires STI screening?  Yes  Has patient been screened once for HCV in the past?  No  No results found for: HCVAB  Does the patient have current drug use (including MJ), have a partner with drug use, and/or has been incarcerated since last result? No  If yes-- Screen for HCV through [facility]   Does the patient meet criteria for HBV testing? No  Criteria:  -Household, sexual or needle sharing contact with  HBV -History of drug use -HIV positive -Those with known Hep C   Health Maintenance Due  Topic Date Due  . Hepatitis C Screening  Never done  . HPV VACCINES (1 - 2-dose series) Never done  . HIV Screening  Never done  . PAP-Cervical Cytology Screening  Never done  . PAP SMEAR-Modifier  Never done  . CHLAMYDIA SCREENING  03/29/2020    Review of Systems  All other systems reviewed and are negative.   The following portions of the patient's history were reviewed and updated as appropriate: allergies, current medications, past family history, past medical history, past social history, past surgical history and problem list. Problem list updated.   See flowsheet for other program required questions.  Objective:   Vitals:   08/02/20 1512  BP: 131/75  Weight: 135 lb (61.2 kg)  Height: 5\' 2"  (1.575 m)    Physical Exam Vitals and nursing note reviewed.  Constitutional:      General: She is not in acute distress.    Appearance: Normal appearance.  HENT:     Head: Normocephalic.     Mouth/Throat:     Mouth: Mucous membranes are moist.     Pharynx: Oropharynx is clear. No oropharyngeal exudate or posterior oropharyngeal erythema.  Eyes:     Conjunctiva/sclera: Conjunctivae normal.  Neck:     Thyroid: No thyroid mass or thyromegaly.  Cardiovascular:     Rate and Rhythm: Normal rate and regular rhythm.  Pulmonary:     Effort: Pulmonary effort is normal.     Breath sounds: Normal breath sounds.  Chest:  Breasts:     Right: Normal.     Left: Normal.    Abdominal:     Palpations: Abdomen is soft. There is no mass.     Tenderness: There is no abdominal tenderness. There is no rebound.  Genitourinary:    General: Normal vulva.     Rectum: Normal.     Comments: External genitalia/pubic area without nits, lice, edema, erythema, lesions and inguinal adenopathy. Vagina with normal mucosa and discharge.pH=<4.5 Cervix without visible lesions. Uterus firm, mobile, nt, no  masses, no CMT, no adnexal tenderness or fullness. Musculoskeletal:     Cervical back: No tenderness.  Lymphadenopathy:     Cervical: No cervical adenopathy.  Skin:    General: Skin is warm and dry.     Findings: No rash.  Neurological:     Mental Status: She is alert and oriented to person, place, and time.  Psychiatric:        Mood and Affect: Mood normal.        Behavior: Behavior normal.        Thought Content: Thought content normal.        Judgment: Judgment normal.       Assessment and Plan:  Deanna Ward is a 20 y.o. female presenting to the [facility] Department for an initial annual wellness/contraceptive visit  Contraception counseling: Reviewed all forms of birth control options in the tiered based approach. available including abstinence; over the counter/barrier methods; hormonal contraceptive medication including pill, patch, ring, injection,contraceptive implant, ECP; hormonal and nonhormonal IUDs; permanent sterilization options including vasectomy and the various tubal sterilization modalities. Risks, benefits, and typical effectiveness rates were reviewed.  Questions were answered.  Written information was also given to the patient to review.  Patient desires nothing today, this was prescribed for patient. She will follow up in  prn for surveillance.  She was told to call with any further questions, or with any concerns about this method of contraception.  Emphasized use of condoms 100% of the time for STI prevention.  Patient was offered ECP. ECP was accepted by the patient. ECP counseling was given - see RN documentation  1. Family planning Please offer ECP to pt Treat wet mount per standing orders Immunization nurse consult Please give dental list to pt If PT neg today pt desires ECP Please counsel pt to do PT on 08/14/20 and call us if + - WET PREP FOR TRICH, YEAST, CLUE - Chlamydia/Gonorrhea [place] [facility] - HIV [place] [facility] - Syphilis  Serology, [place] [facility] - Pap IG (Image Guided)  2.  Well woman exam with routine gynecological exam      No follow-ups on file.  No future appointments.  Alberteen Spindle, CNM

## 2020-08-06 LAB — PAP IG (IMAGE GUIDED): PAP Smear Comment: 0

## 2020-08-08 LAB — HM HIV SCREENING LAB: HM HIV Screening: NEGATIVE

## 2021-02-04 ENCOUNTER — Encounter: Payer: Self-pay | Admitting: Advanced Practice Midwife

## 2021-02-06 ENCOUNTER — Ambulatory Visit: Payer: Medicaid Other | Admitting: Advanced Practice Midwife

## 2021-02-06 ENCOUNTER — Other Ambulatory Visit: Payer: Self-pay

## 2021-02-06 ENCOUNTER — Encounter: Payer: Self-pay | Admitting: Advanced Practice Midwife

## 2021-02-06 DIAGNOSIS — Z113 Encounter for screening for infections with a predominantly sexual mode of transmission: Secondary | ICD-10-CM | POA: Diagnosis not present

## 2021-02-06 LAB — WET PREP FOR TRICH, YEAST, CLUE
Trichomonas Exam: NEGATIVE
Yeast Exam: NEGATIVE

## 2021-02-06 NOTE — Progress Notes (Signed)
Pt here for STD screening.  Wet mount results reviewed, no treatment required.  Condoms declined.  Sheana Bir M Montanna Mcbain, RN' 

## 2021-02-06 NOTE — Progress Notes (Signed)
[facility] Department STI clinic/screening visit  Subjective:  Deanna Ward is a 20 y.o. SHF nonsmoker G1P0 female being seen today for an STI screening visit. The patient reports they do have symptoms.  Patient reports that they do not desire a pregnancy in the next year.   They reported they are not interested in discussing contraception today.  Patient's last menstrual period was 01/12/2021 (exact date).   Patient has the following medical conditions:   Patient Active Problem List   Diagnosis Date Noted   Rash and nonspecific skin eruption 06/10/2018   Seasonal allergies 06/10/2018   Menorrhagia with irregular cycle 03/03/2018   Excessive or frequent menstruation 03/03/2018   Iron deficiency anemia 03/03/2018    Chief Complaint  Patient presents with   SEXUALLY TRANSMITTED DISEASE    screening    HPI  Patient reports foul odor x 1 week. LMP 01/12/21. Last sex 01/29/21 without condom; with current partner x 1 year; 1 partner in last 3 mo. Last MJ 09/2018. Last ETOH 02/03/21 (1 Margarita) qo weekend.   Last HIV test per patient/review of record was 01/11/21 Patient reports last pap was 08/02/20 neg  See flowsheet for further details and programmatic requirements.    The following portions of the patient's history were reviewed and updated as appropriate: allergies, current medications, past medical history, past social history, past surgical history and problem list.  Objective:  There were no vitals filed for this visit.  Physical Exam Vitals and nursing note reviewed.  Constitutional:      Appearance: Normal appearance. She is normal weight.  HENT:     Head: Normocephalic and atraumatic.     Mouth/Throat:     Mouth: Mucous membranes are moist.     Pharynx: Oropharynx is clear. No oropharyngeal exudate or posterior oropharyngeal erythema.  Eyes:     Conjunctiva/sclera: Conjunctivae normal.  Pulmonary:     Effort: Pulmonary effort is normal.   Abdominal:     General: Abdomen is flat.     Palpations: Abdomen is soft. There is no mass.     Tenderness: There is no abdominal tenderness. There is no rebound.     Comments: Soft without masses or tenderness, fair tone  Genitourinary:    General: Normal vulva.     Exam position: Lithotomy position.     Pubic Area: No rash or pubic lice.      Labia:        Right: No rash or lesion.        Left: No rash or lesion.      Vagina: Vaginal discharge (increased white creamy leukorrhea, ph<4.5) present. No erythema, bleeding or lesions.     Cervix: Normal.     Uterus: Normal.      Adnexa: Right adnexa normal and left adnexa normal.     Rectum: Normal.  Lymphadenopathy:     Head:     Right side of head: No preauricular or posterior auricular adenopathy.     Left side of head: No preauricular or posterior auricular adenopathy.     Cervical: No cervical adenopathy.     Right cervical: No superficial, deep or posterior cervical adenopathy.    Left cervical: No superficial, deep or posterior cervical adenopathy.     Upper Body:     Right upper body: No supraclavicular or axillary adenopathy.     Left upper body: No supraclavicular or axillary adenopathy.     Lower Body: No right inguinal adenopathy. No left inguinal adenopathy.  Skin:    General: Skin is warm and dry.     Findings: No rash.  Neurological:     Mental Status: She is alert and oriented to person, place, and time.     Assessment and Plan:  Deanna Ward is a 20 y.o. female presenting to the [facility] Department for STI screening  1. Screening examination for venereal disease Treat wet mount per standing orders Immunization nurse consult - WET PREP FOR TRICH, YEAST, CLUE - Chlamydia/Gonorrhea [place]     No follow-ups on file.  No future appointments.  Alberteen Spindle, CNM

## 2021-02-10 LAB — GONOCOCCUS CULTURE

## 2021-03-05 IMAGING — US US PELVIS COMPLETE TRANSABD/TRANSVAG W DUPLEX
1 series · 13 of 25 positions shown · non-contrast
Comparison: None.

CLINICAL DATA: 18-year-old female with left pelvic pain.

EXAM:
TRANSABDOMINAL AND TRANSVAGINAL ULTRASOUND OF PELVIS
DOPPLER ULTRASOUND OF OVARIES
TECHNIQUE: Both transabdominal and transvaginal ultrasound examinations of the
pelvis were performed. Transabdominal technique was performed for
global imaging of the pelvis including uterus, ovaries, adnexal
regions, and pelvic cul-de-sac.
It was necessary to proceed with endovaginal exam following the
transabdominal exam to visualize the endometrium and ovaries. Color
and duplex Doppler ultrasound was utilized to evaluate blood flow to
the ovaries.

[Series 1: us pelvis complete transabd/transvag w duplex · 13 of 76 slices shown]
[im 1/76]
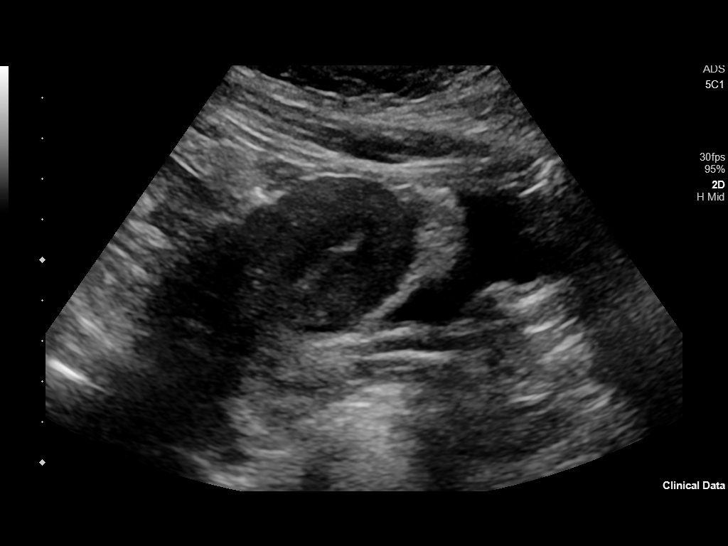
[im 7/76]
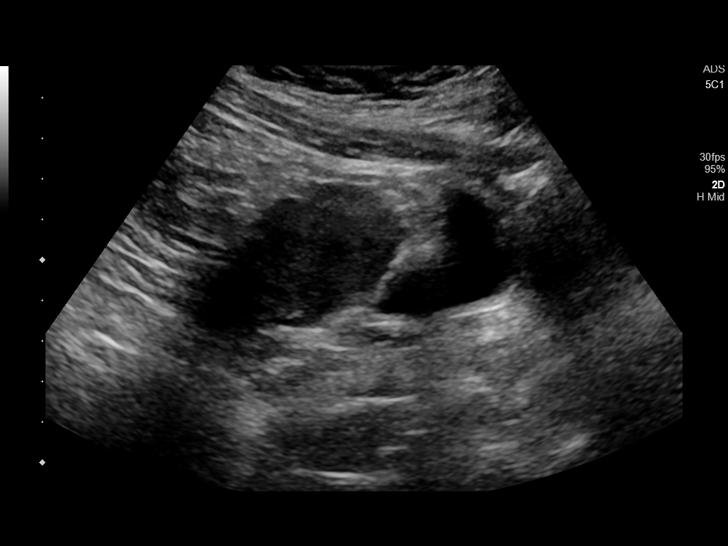
[im 13/76]
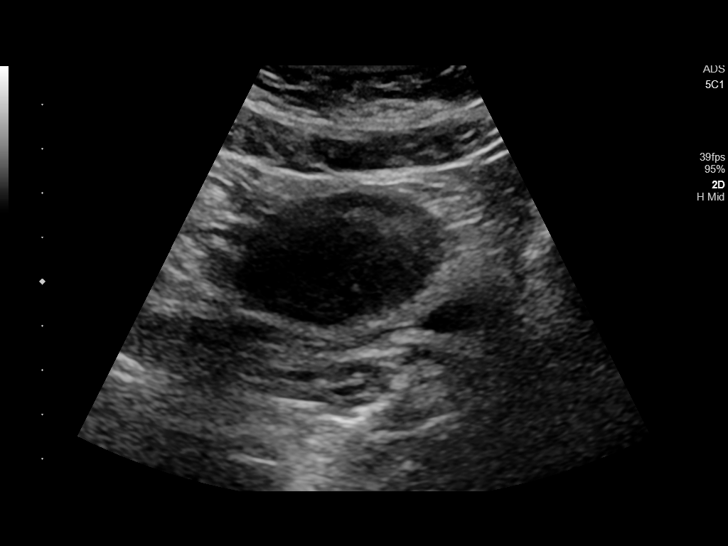
[im 19/76]
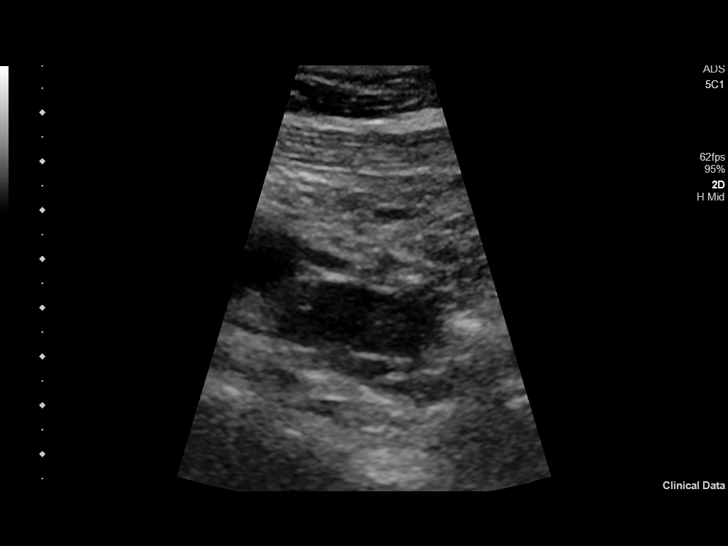
[im 26/76]
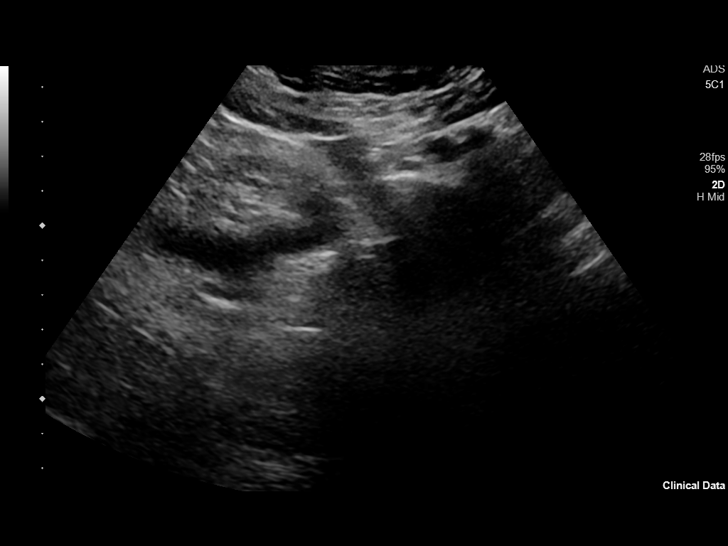
[im 32/76]
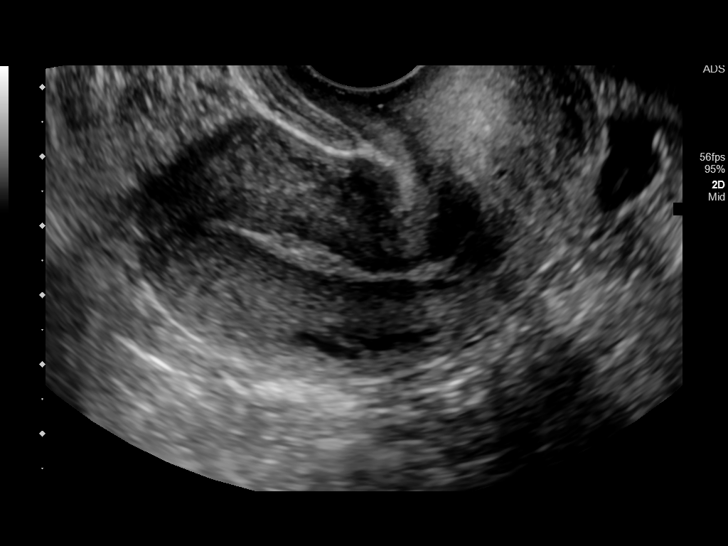
[im 38/76]
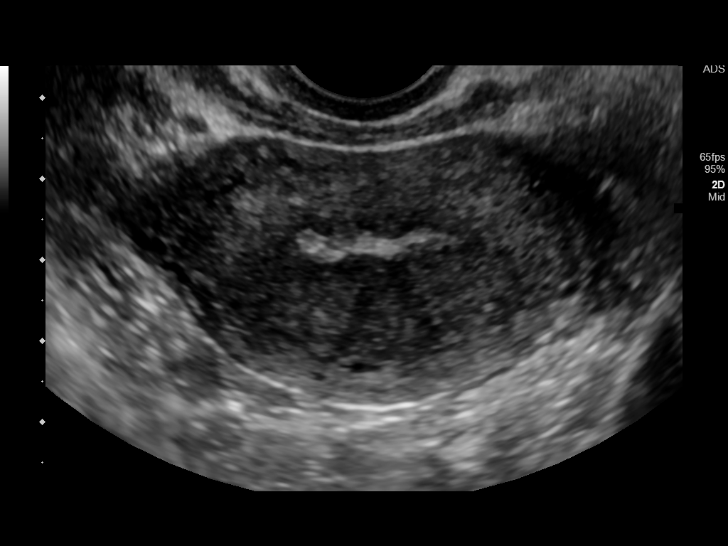
[im 44/76]
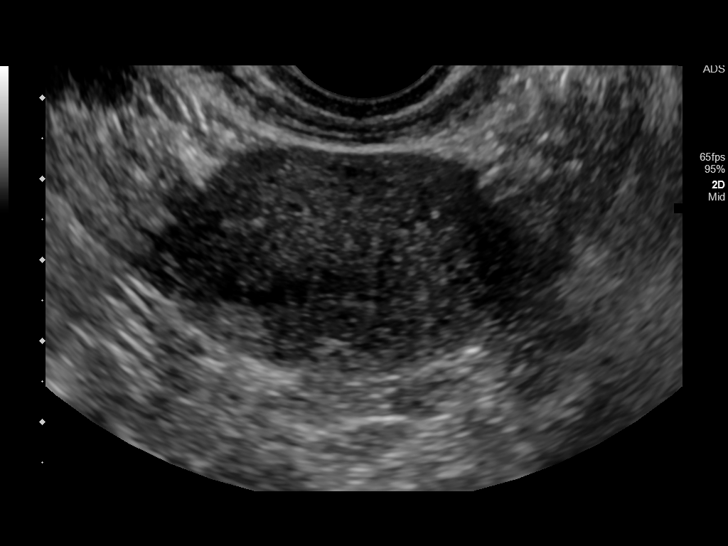
[im 51/76]
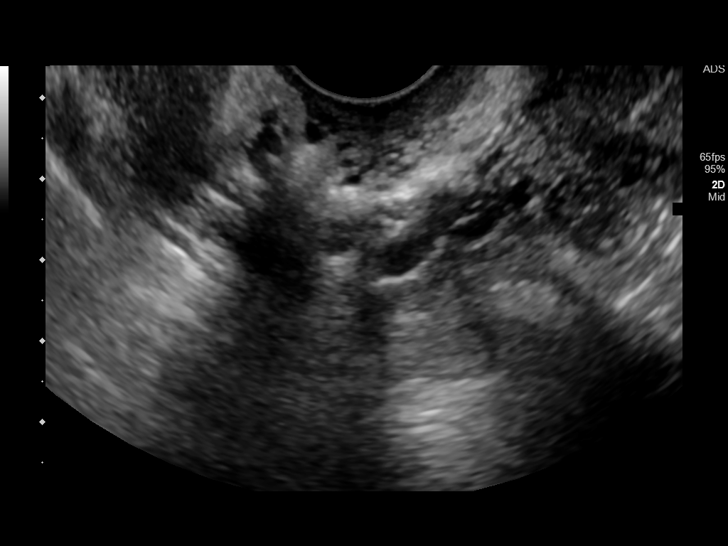
[im 57/76]
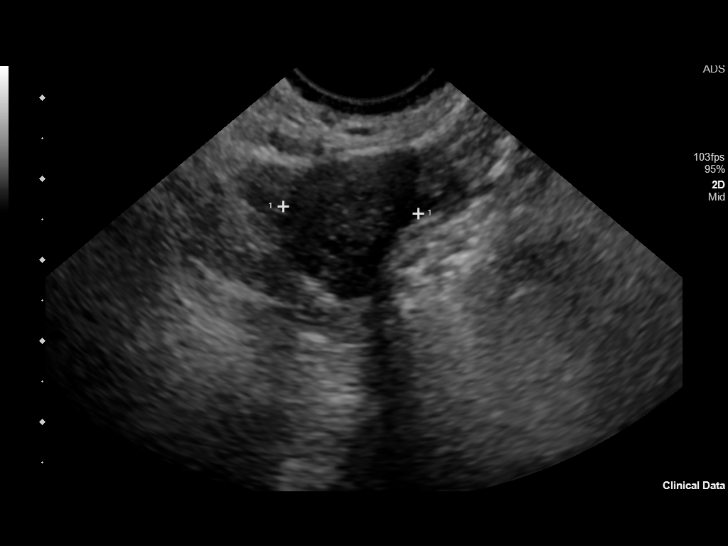
[im 63/76]
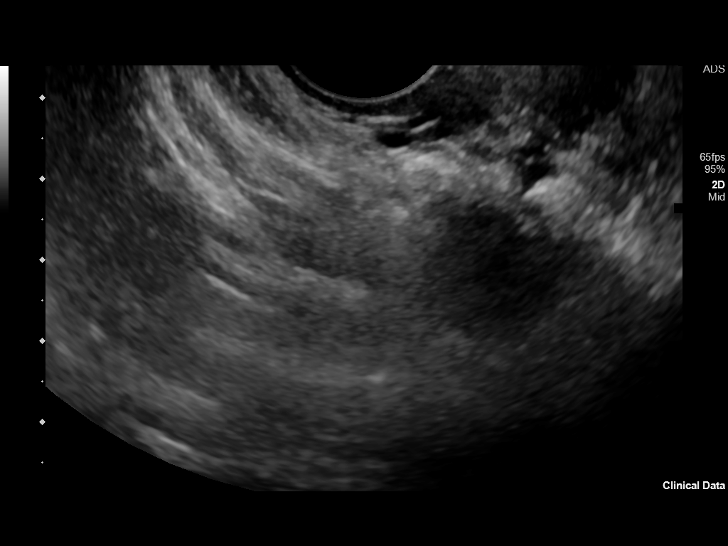
[im 69/76]
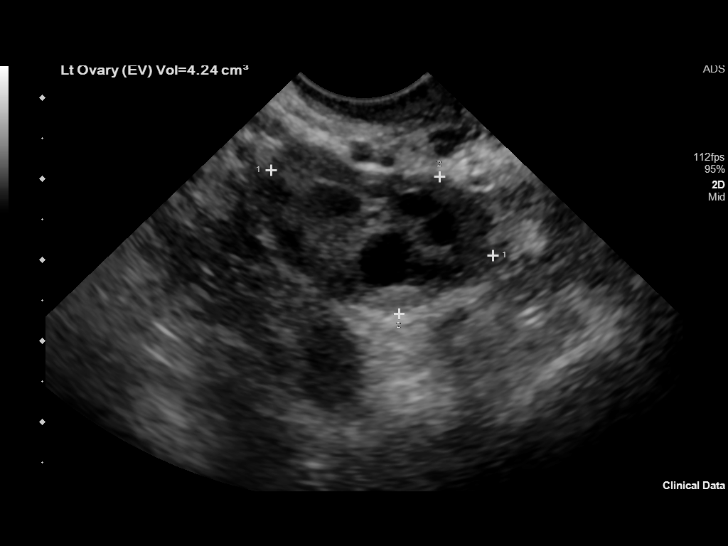
[im 76/76]
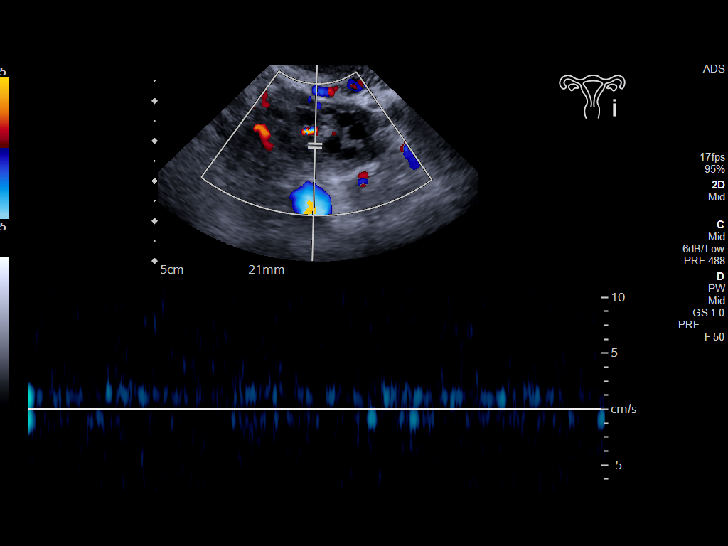

[13 of 25 positions shown; findings below may reference images not displayed]

FINDINGS: Uterus

Measurements: 6.8 x 3.6 x 5.4 cm = volume: 69 mL. The uterus is
anteverted and appears unremarkable.

Endometrium

Thickness: 4 mm.  No focal abnormality visualized.

Right ovary

Measurements: 2.7 x 1.3 x 1.7 cm = volume: 3.1 mL. Normal
appearance/no adnexal mass.

Left ovary

Measurements: 2.9 x 1.8 x 1.6 cm = volume: 4.2 mL. Normal
appearance/no adnexal mass.

Pulsed Doppler evaluation of both ovaries demonstrates normal
low-resistance arterial and venous waveforms.

Other findings

Trace free fluid within the pelvis.
IMPRESSION: Unremarkable pelvic ultrasound.

## 2021-08-09 ENCOUNTER — Ambulatory Visit: Payer: Medicaid Other

## 2021-08-16 ENCOUNTER — Ambulatory Visit: Payer: Medicaid Other | Admitting: Family Medicine

## 2021-08-16 ENCOUNTER — Encounter: Payer: Self-pay | Admitting: Family Medicine

## 2021-08-16 DIAGNOSIS — Z113 Encounter for screening for infections with a predominantly sexual mode of transmission: Secondary | ICD-10-CM | POA: Diagnosis not present

## 2021-08-16 DIAGNOSIS — B9689 Other specified bacterial agents as the cause of diseases classified elsewhere: Secondary | ICD-10-CM

## 2021-08-16 LAB — WET PREP FOR TRICH, YEAST, CLUE
Trichomonas Exam: NEGATIVE
Yeast Exam: NEGATIVE

## 2021-08-16 LAB — HM HIV SCREENING LAB: HM HIV Screening: NEGATIVE

## 2021-08-16 MED ORDER — METRONIDAZOLE 500 MG PO TABS: 500.0000 mg | ORAL_TABLET | Freq: Two times a day (BID) | ORAL | 0 refills | Status: AC

## 2021-08-16 NOTE — Progress Notes (Signed)
[facility] Department  STI clinic/screening visit [address] [place] [address] [phone]  Subjective:  Deanna Ward is a 21 y.o. female being seen today for an STI screening visit. The patient reports they do have symptoms.  Patient reports that they do not desire a pregnancy in the next year.   They reported they are not interested in discussing contraception today.    No LMP recorded.   Patient has the following medical conditions:   Patient Active Problem List   Diagnosis Date Noted   Rash and nonspecific skin eruption 06/10/2018   Seasonal allergies 06/10/2018   Menorrhagia with irregular cycle 03/03/2018   Excessive or frequent menstruation 03/03/2018   Iron deficiency anemia 03/03/2018    Chief Complaint  Patient presents with   SEXUALLY TRANSMITTED DISEASE    STI screening. Endorses vaginal discharge x one month    HPI  Patient reports she has had a light green discharge x 1 month.  Denies odor, vaginal irritation, lower abd. Pain.  Last HIV test per patient/review of record was 5/10/2-022                         Patient reports last pap was 08/02/2020   Screening for MPX risk: Does the patient have an unexplained rash? No Is the patient MSM? No Does the patient endorse multiple sex partners or anonymous sex partners? No Did the patient have close or sexual contact with a person diagnosed with MPX? No Has the patient traveled outside the [place] where MPX is endemic? No Is there a high clinical suspicion for MPX-- evidenced by one of the following No  -Unlikely to be chickenpox  -Lymphadenopathy  -Rash that present in same phase of evolution on any given body part See flowsheet for further details and programmatic requirements.   Immunization history:  Immunization History  Administered Date(s) Administered   Hepatitis A 02/04/2007, 11/20/2007   Hepatitis B 1998/07/24, 09/28/1999, 10/15/1999, 08/15/2003   Hpv-Unspecified  09/29/2009, 12/01/2009, 10/12/2010   Meningococcal Mcv4o 01/30/2018   Tdap 01/30/2018     The following portions of the patient's history were reviewed and updated as appropriate: allergies, current medications, past medical history, past social history, past surgical history and problem list.  Objective:  There were no vitals filed for this visit.  Physical Exam Constitutional:      Appearance: Normal appearance.  HENT:     Head: Normocephalic and atraumatic.  Pulmonary:     Effort: Pulmonary effort is normal.  Abdominal:     Palpations: Abdomen is soft.  Genitourinary:    Comments: Client self collected wet prep and GC/CT  Exam was declined. Musculoskeletal:        General: Normal range of motion.  Skin:    General: Skin is warm and dry.  Neurological:     General: No focal deficit present.     Mental Status: She is alert.  Psychiatric:        Mood and Affect: Mood normal.        Behavior: Behavior normal.     Assessment and Plan:  Deanna Ward is a 20 y.o. female presenting to the [facility] Department for STI screening  1. Screening examination for venereal disease  - WET PREP FOR TRICH, YEAST, CLUE - Gonococcus culture - Chlamydia/Gonorrhea [place] [facility] - HIV [place] [facility] - Syphilis Serology, [facility]  2. Bacterial vaginosis  - metroNIDAZOLE (FLAGYL) 500  MG tablet; Take 1 tablet (500 mg total) by mouth 2 (two) times daily for 7 days.  Dispense: 14 tablet; Refill: 0  Co. To use condoms for STD prevention   No follow-ups on file.  No future appointments.  Deanna Pickett, FNP

## 2021-08-16 NOTE — Progress Notes (Signed)
WET prep reveals BV. Treated per standing orders. Pt verbalized understanding of instructions and follow up.  Lethea Killings RN

## 2021-08-20 LAB — GONOCOCCUS CULTURE

## 2021-10-25 ENCOUNTER — Encounter: Payer: Self-pay | Admitting: Advanced Practice Midwife

## 2021-10-25 ENCOUNTER — Ambulatory Visit: Payer: Medicaid Other | Admitting: Advanced Practice Midwife

## 2021-10-25 DIAGNOSIS — Z113 Encounter for screening for infections with a predominantly sexual mode of transmission: Secondary | ICD-10-CM | POA: Diagnosis not present

## 2021-10-25 LAB — WET PREP FOR TRICH, YEAST, CLUE
Trichomonas Exam: NEGATIVE
Yeast Exam: NEGATIVE

## 2021-10-25 NOTE — Progress Notes (Signed)
Pt here for STD screening.  Wet mount results reviewed, no treatment required per SO.  Condoms declined.  Jamesen Stahnke M Martin Smeal, RN  

## 2021-10-25 NOTE — Progress Notes (Signed)
[facility] Department  STI clinic/screening visit [address] [place] [address] [phone]  Subjective:  Deanna Ward is a 21 y.o. female being seen today for an STI screening visit. The patient reports they do have symptoms.  Patient reports that they do not desire a pregnancy in the next year.   They reported they are not interested in discussing contraception today.    Patient's last menstrual period was 10/10/2021 (exact date).   Patient has the following medical conditions:   Patient Active Problem List   Diagnosis Date Noted   Rash and nonspecific skin eruption 06/10/2018   Seasonal allergies 06/10/2018   Menorrhagia with irregular cycle 03/03/2018   Excessive or frequent menstruation 03/03/2018   Iron deficiency anemia 03/03/2018    Chief Complaint  Patient presents with   SEXUALLY TRANSMITTED DISEASE    Screening    HPI  Patient reports c/o increased white d/c in past 4 days. LMP 10/10/21. Last sex 10/09/21 without condom; with current partner x 2 years. Last MJ 05/2021. Last ETOH 10/19/21 (1 Margarita) q weekends.   Last HIV test per patient/review of record was 08/16/21 Patient reports last pap was 08/02/20 neg  Screening for MPX risk: Does the patient have an unexplained rash? No Is the patient MSM? No Does the patient endorse multiple sex partners or anonymous sex partners? No Did the patient have close or sexual contact with a person diagnosed with MPX? No Has the patient traveled outside the [place] where MPX is endemic? No Is there a high clinical suspicion for MPX-- evidenced by one of the following No  -Unlikely to be chickenpox  -Lymphadenopathy  -Rash that present in same phase of evolution on any given body part See flowsheet for further details and programmatic requirements.   Immunization history:  Immunization History  Administered Date(s) Administered   Hepatitis A 02/04/2007, 11/20/2007   Hepatitis B Apr 08, 1998,  09/28/1999, 10/15/1999, 08/15/2003   Hpv-Unspecified 09/29/2009, 12/01/2009, 10/12/2010   Meningococcal Mcv4o 01/30/2018   Tdap 01/30/2018     The following portions of the patient's history were reviewed and updated as appropriate: allergies, current medications, past medical history, past social history, past surgical history and problem list.  Objective:  There were no vitals filed for this visit.  Physical Exam Vitals and nursing note reviewed.  Constitutional:      Appearance: Normal appearance. She is normal weight.  HENT:     Head: Normocephalic and atraumatic.     Mouth/Throat:     Mouth: Mucous membranes are moist.     Pharynx: Oropharynx is clear. No oropharyngeal exudate or posterior oropharyngeal erythema.  Eyes:     Conjunctiva/sclera: Conjunctivae normal.  Pulmonary:     Effort: Pulmonary effort is normal.  Abdominal:     General: Abdomen is flat.     Palpations: Abdomen is soft. There is no mass.     Tenderness: There is no abdominal tenderness. There is no rebound.     Comments: Soft without masses or tenderness, good tone  Genitourinary:    General: Normal vulva.     Exam position: Lithotomy position.     Pubic Area: No rash or pubic lice.      Labia:        Right: No rash or lesion.        Left: No rash or lesion.      Vagina: Vaginal discharge (white creamy leukorrhea, ph<4.5) present. No erythema, bleeding or lesions.     Cervix:  Normal.     Uterus: Normal.      Adnexa: Right adnexa normal and left adnexa normal.     Rectum: Normal.     Comments: pH = <4.5 Lymphadenopathy:     Head:     Right side of head: No preauricular or posterior auricular adenopathy.     Left side of head: No preauricular or posterior auricular adenopathy.     Cervical: No cervical adenopathy.     Right cervical: No superficial, deep or posterior cervical adenopathy.    Left cervical: No superficial, deep or posterior cervical adenopathy.     Upper Body:     Right upper  body: No supraclavicular, axillary or epitrochlear adenopathy.     Left upper body: No supraclavicular, axillary or epitrochlear adenopathy.     Lower Body: No right inguinal adenopathy. No left inguinal adenopathy.  Skin:    General: Skin is warm and dry.     Findings: No rash.  Neurological:     Mental Status: She is alert and oriented to person, place, and time.      Assessment and Plan:  Deanna Ward is a 21 y.o. female presenting to the [facility] Department for STI screening  1. Screening examination for venereal disease Please give pt contact card for Kathreen Cosier, LCSW Treat wet mount per standing orders Immunization nurse consult - WET PREP FOR TRICH, YEAST, CLUE - Chlamydia/Gonorrhea [place] [facility] - Gonococcus culture - Ambulatory referral to Behavioral Health     No follow-ups on file.  No future appointments.  Alberteen Spindle, CNM

## 2021-10-29 LAB — GONOCOCCUS CULTURE

## 2022-05-29 ENCOUNTER — Emergency Department: Payer: Commercial Managed Care - PPO

## 2022-05-29 DIAGNOSIS — J40 Bronchitis, not specified as acute or chronic: Secondary | ICD-10-CM | POA: Insufficient documentation

## 2022-05-29 DIAGNOSIS — Z1152 Encounter for screening for COVID-19: Secondary | ICD-10-CM | POA: Insufficient documentation

## 2022-05-29 DIAGNOSIS — R0981 Nasal congestion: Secondary | ICD-10-CM | POA: Diagnosis present

## 2022-05-29 LAB — RESP PANEL BY RT-PCR (RSV, FLU A&B, COVID)  RVPGX2
Influenza A by PCR: NEGATIVE
Influenza B by PCR: NEGATIVE
Resp Syncytial Virus by PCR: NEGATIVE
SARS Coronavirus 2 by RT PCR: NEGATIVE

## 2022-05-29 LAB — GROUP A STREP BY PCR: Group A Strep by PCR: NOT DETECTED

## 2022-05-29 MED ORDER — ACETAMINOPHEN 500 MG PO TABS
1000.0000 mg | ORAL_TABLET | Freq: Once | ORAL | Status: AC
  Administered 2022-05-29: 1000 mg via ORAL
  Filled 2022-05-29: qty 2

## 2022-05-29 NOTE — ED Triage Notes (Signed)
Pt presents ambulatory to triage via POV with complaints of cough, nasal congestion, and fevers for the last 2 days. Pt states the cough is productive. No meds taken PTA. A&Ox4 at this time. Denies CP or SOB.

## 2022-05-30 ENCOUNTER — Emergency Department
Admission: EM | Admit: 2022-05-30 | Discharge: 2022-05-30 | Disposition: A | Discharge status: Home / Self Care | Payer: Commercial Managed Care - PPO | Attending: Emergency Medicine | Admitting: Emergency Medicine

## 2022-05-30 DIAGNOSIS — J40 Bronchitis, not specified as acute or chronic: Secondary | ICD-10-CM | POA: Diagnosis not present

## 2022-05-30 MED ORDER — ALBUTEROL SULFATE HFA 108 (90 BASE) MCG/ACT IN AERS
2.0000 | INHALATION_SPRAY | Freq: Once | RESPIRATORY_TRACT | Status: AC
  Administered 2022-05-30: 2 via RESPIRATORY_TRACT
  Filled 2022-05-30: qty 6.7

## 2022-05-30 MED ORDER — PREDNISONE 20 MG PO TABS
60.0000 mg | ORAL_TABLET | Freq: Once | ORAL | Status: AC
  Administered 2022-05-30: 60 mg via ORAL
  Filled 2022-05-30: qty 3

## 2022-05-30 MED ORDER — AZITHROMYCIN 250 MG PO TABS: 250.0000 mg | ORAL_TABLET | Freq: Every day | ORAL | 0 refills | Status: DC

## 2022-05-30 MED ORDER — PREDNISONE 20 MG PO TABS: ORAL_TABLET | ORAL | 0 refills | Status: DC

## 2022-05-30 MED ORDER — HYDROCOD POLI-CHLORPHE POLI ER 10-8 MG/5ML PO SUER: 5.0000 mL | Freq: Two times a day (BID) | ORAL | 0 refills | Status: DC | PRN

## 2022-05-30 MED ORDER — AZITHROMYCIN 500 MG PO TABS
500.0000 mg | ORAL_TABLET | Freq: Once | ORAL | Status: AC
  Administered 2022-05-30: 500 mg via ORAL
  Filled 2022-05-30: qty 1

## 2022-05-30 NOTE — Discharge Instructions (Signed)
1.  Finish steroid and antibiotic as prescribed. 2.  You may take Tussionex as needed for cough. 3.  Return to the ER for worsening symptoms, persistent vomiting, difficulty breathing or other concerns.

## 2022-05-30 NOTE — ED Provider Notes (Signed)
[facility] Provider Note    Event Date/Time   First MD Initiated Contact with Patient 05/30/22 0101     (approximate)   History   Nasal Congestion   HPI  Deanna Ward is a 22 y.o. female who presents to the ED from home with a chief complaint of throat, productive cough, nasal congestion and low-grade fevers x 2 days.  Denies chest pain, shortness of breath, abdominal pain, nausea, vomiting or dizziness.  Endorses wheezing.     Past Medical History  No past medical history on file.   Active Problem List   Patient Active Problem List   Diagnosis Date Noted   Rash and nonspecific skin eruption 06/10/2018   Seasonal allergies 06/10/2018   Menorrhagia with irregular cycle 03/03/2018   Excessive or frequent menstruation 03/03/2018   Iron deficiency anemia 03/03/2018     Past Surgical History  No past surgical history on file.   Home Medications   Prior to Admission medications   Medication Sig Start Date End Date Taking? Authorizing Provider  azithromycin (ZITHROMAX) 250 MG tablet Take 1 tablet (250 mg total) by mouth daily. 05/30/22  Yes Paulette Blanch, MD  chlorpheniramine-HYDROcodone (TUSSIONEX) 10-8 MG/5ML Take 5 mLs by mouth every 12 (twelve) hours as needed for cough. 05/30/22  Yes Paulette Blanch, MD  predniSONE (DELTASONE) 20 MG tablet 3 tablets daily x 4 days 05/30/22  Yes Paulette Blanch, MD  ferrous sulfate 325 (65 FE) MG EC tablet Take 1 tablet (325 mg total) by mouth daily with breakfast. 03/30/19 04/29/19  Duffy Bruce, MD  fluticasone (FLONASE) 50 MCG/ACT nasal spray Place 2 sprays into both nostrils daily. Patient not taking: Reported on 08/02/2020 06/10/18   Scot Jun, NP  Norgestimate-Ethinyl Estradiol Triphasic 0.18/0.215/0.25 MG-25 MCG tab Take 1 tablet by mouth daily. Patient not taking: Reported on 08/02/2020 04/26/19   Jerene Dilling, PA     Allergies  Patient has no known allergies.   Family History   Family  History  Problem Relation Age of Onset   Healthy Mother    Healthy Father    Healthy Sister    Healthy Sister    Healthy Sister    Stroke Neg Hx    Heart disease Neg Hx    Cancer Neg Hx      Physical Exam  Triage Vital Signs: ED Triage Vitals  Enc Vitals Group     BP 05/29/22 2206 130/82     Pulse Rate 05/29/22 2206 (!) 116     Resp 05/29/22 2206 20     Temp 05/29/22 2206 (!) 100.6 F (38.1 C)     Temp Source 05/29/22 2206 Oral     SpO2 05/29/22 2206 96 %     Weight 05/29/22 2207 135 lb (61.2 kg)     Height 05/29/22 2207 '5\' 2"'$  (1.575 m)     Head Circumference --      Peak Flow --      Pain Score --      Pain Loc --      Pain Edu? --      Excl. in [place]? --     Updated Vital Signs: BP 130/82 (BP Location: Left Arm)   Pulse (!) 116   Temp (!) 100.6 F (38.1 C) (Oral)   Resp 20   Ht '5\' 2"'$  (1.575 m)   Wt 61.2 kg   LMP 05/02/2022 (Approximate)   SpO2 96%   BMI 24.69 kg/m  General: Awake, no distress.  CV:  RRR.  Good peripheral perfusion.  Resp:  Normal effort.  Scattered wheezing. Abd:  No distention.  Other:  No petechiae.   ED Results / Procedures / Treatments  Labs (all labs ordered are listed, but only abnormal results are displayed) Labs Reviewed  RESP PANEL BY RT-PCR (RSV, FLU A&B, COVID)  RVPGX2  GROUP A STREP BY PCR     EKG  None   RADIOLOGY I have independently visualized and interpreted patient's chest x-ray as well as noted the radiology interpretation:  Chest x-ray: No acute cardiopulmonary process  Official radiology report(s): DG Chest 2 View  Result Date: 05/29/2022 CLINICAL DATA:  Cough EXAM: CHEST - 2 VIEW COMPARISON:  None Available. FINDINGS: The heart size and mediastinal contours are within normal limits. Both lungs are clear. The visualized skeletal structures are unremarkable. IMPRESSION: No active cardiopulmonary disease. Electronically Signed   By: Fidela Salisbury M.D.   On: 05/29/2022 22:33     PROCEDURES:  Critical  Care performed: No  Procedures   MEDICATIONS ORDERED IN ED: Medications  albuterol (VENTOLIN HFA) 108 (90 Base) MCG/ACT inhaler 2 puff (has no administration in time range)  predniSONE (DELTASONE) tablet 60 mg (has no administration in time range)  azithromycin (ZITHROMAX) tablet 500 mg (has no administration in time range)  acetaminophen (TYLENOL) tablet 1,000 mg (1,000 mg Oral Given 05/29/22 2210)     IMPRESSION / MDM / ASSESSMENT AND PLAN / ED COURSE  I reviewed the triage vital signs and the nursing notes.                             22 year old female presenting with cold-like symptoms.  Respiratory panel, group A strep and chest x-ray unremarkable.  Will treat with Z-Pak, prednisone burst and albuterol inhaler.  Strict return precautions given.  Patient verbalizes understanding and agrees with plan of care.  Patient's presentation is most consistent with acute, uncomplicated illness.   FINAL CLINICAL IMPRESSION(S) / ED DIAGNOSES   Final diagnoses:  Bronchitis     Rx / DC Orders   ED Discharge Orders          Ordered    azithromycin (ZITHROMAX) 250 MG tablet  Daily        05/30/22 0113    chlorpheniramine-HYDROcodone (TUSSIONEX) 10-8 MG/5ML  Every 12 hours PRN        05/30/22 0113    predniSONE (DELTASONE) 20 MG tablet        05/30/22 0113             Note:  This document was prepared using Dragon voice recognition software and may include unintentional dictation errors.   Paulette Blanch, MD 05/30/22 [phone]

## 2022-08-08 ENCOUNTER — Emergency Department: Payer: Commercial Managed Care - PPO

## 2022-08-08 ENCOUNTER — Encounter: Payer: Self-pay | Admitting: Emergency Medicine

## 2022-08-08 DIAGNOSIS — O209 Hemorrhage in early pregnancy, unspecified: Secondary | ICD-10-CM | POA: Diagnosis present

## 2022-08-08 DIAGNOSIS — Z3A01 Less than 8 weeks gestation of pregnancy: Secondary | ICD-10-CM | POA: Diagnosis not present

## 2022-08-08 DIAGNOSIS — Z5321 Procedure and treatment not carried out due to patient leaving prior to being seen by health care provider: Secondary | ICD-10-CM | POA: Insufficient documentation

## 2022-08-08 LAB — CBC WITH DIFFERENTIAL/PLATELET
Abs Immature Granulocytes: 0.04 10*3/uL (ref 0.00–0.07)
Basophils Absolute: 0.1 10*3/uL (ref 0.0–0.1)
Basophils Relative: 0 %
Eosinophils Absolute: 0.7 10*3/uL — ABNORMAL HIGH (ref 0.0–0.5)
Eosinophils Relative: 6 %
HCT: 38.9 % (ref 36.0–46.0)
Hemoglobin: 12.6 g/dL (ref 12.0–15.0)
Immature Granulocytes: 0 %
Lymphocytes Relative: 20 %
Lymphs Abs: 2.4 10*3/uL (ref 0.7–4.0)
MCH: 28.3 pg (ref 26.0–34.0)
MCHC: 32.4 g/dL (ref 30.0–36.0)
MCV: 87.4 fL (ref 80.0–100.0)
Monocytes Absolute: 0.8 10*3/uL (ref 0.1–1.0)
Monocytes Relative: 7 %
Neutro Abs: 7.9 10*3/uL — ABNORMAL HIGH (ref 1.7–7.7)
Neutrophils Relative %: 67 %
Platelets: 290 10*3/uL (ref 150–400)
RBC: 4.45 MIL/uL (ref 3.87–5.11)
RDW: 13.4 % (ref 11.5–15.5)
WBC: 11.9 10*3/uL — ABNORMAL HIGH (ref 4.0–10.5)
nRBC: 0 % (ref 0.0–0.2)

## 2022-08-08 LAB — URINALYSIS, ROUTINE W REFLEX MICROSCOPIC
Bilirubin Urine: NEGATIVE
Glucose, UA: NEGATIVE mg/dL
Ketones, ur: NEGATIVE mg/dL
Nitrite: NEGATIVE
Protein, ur: NEGATIVE mg/dL
Specific Gravity, Urine: 1.002 — ABNORMAL LOW (ref 1.005–1.030)
pH: 7 (ref 5.0–8.0)

## 2022-08-08 LAB — ABO/RH: ABO/RH(D): O POS

## 2022-08-08 LAB — COMPREHENSIVE METABOLIC PANEL
ALT: 24 U/L (ref 0–44)
AST: 25 U/L (ref 15–41)
Albumin: 4.2 g/dL (ref 3.5–5.0)
Alkaline Phosphatase: 70 U/L (ref 38–126)
Anion gap: 7 (ref 5–15)
BUN: 8 mg/dL (ref 6–20)
CO2: 26 mmol/L (ref 22–32)
Calcium: 9.1 mg/dL (ref 8.9–10.3)
Chloride: 103 mmol/L (ref 98–111)
Creatinine, Ser: 0.61 mg/dL (ref 0.44–1.00)
GFR, Estimated: >60 mL/min (ref >60)
Glucose, Bld: 85 mg/dL (ref 70–99)
Potassium: 3.8 mmol/L (ref 3.5–5.1)
Sodium: 136 mmol/L (ref 135–145)
Total Bilirubin: 0.6 mg/dL (ref 0.3–1.2)
Total Protein: 7.3 g/dL (ref 6.5–8.1)

## 2022-08-08 LAB — POC URINE PREG, ED: Preg Test, Ur: POSITIVE — AB

## 2022-08-08 NOTE — ED Triage Notes (Signed)
Pt presents ambulatory to triage via POV with complaints of vaginal bleeding starting today that is light in nature. She notes having some bleeding when wiping and the bleeding is light in nature. Pt is [redacted] weeks pregnant and has some mild cramping in her pelvic area. A&Ox4 at this time. Denies dizziness, CP or SOB.

## 2022-08-09 ENCOUNTER — Emergency Department
Admission: EM | Admit: 2022-08-09 | Discharge: 2022-08-09 | Discharge status: Against Medical Advice | Payer: Commercial Managed Care - PPO | Attending: Emergency Medicine | Admitting: Emergency Medicine

## 2022-08-14 ENCOUNTER — Emergency Department: Payer: Commercial Managed Care - PPO

## 2022-08-14 ENCOUNTER — Emergency Department
Admission: EM | Admit: 2022-08-14 | Discharge: 2022-08-14 | Disposition: A | Discharge status: Home / Self Care | Payer: Commercial Managed Care - PPO | Attending: Emergency Medicine | Admitting: Emergency Medicine

## 2022-08-14 ENCOUNTER — Other Ambulatory Visit: Payer: Self-pay

## 2022-08-14 DIAGNOSIS — Z6740 Type O blood, Rh positive: Secondary | ICD-10-CM | POA: Diagnosis not present

## 2022-08-14 DIAGNOSIS — O209 Hemorrhage in early pregnancy, unspecified: Secondary | ICD-10-CM | POA: Insufficient documentation

## 2022-08-14 DIAGNOSIS — Z3A01 Less than 8 weeks gestation of pregnancy: Secondary | ICD-10-CM | POA: Insufficient documentation

## 2022-08-14 LAB — URINALYSIS, ROUTINE W REFLEX MICROSCOPIC
Bilirubin Urine: NEGATIVE
Glucose, UA: NEGATIVE mg/dL
Ketones, ur: NEGATIVE mg/dL
Nitrite: NEGATIVE
Protein, ur: NEGATIVE mg/dL
Specific Gravity, Urine: 1.016 (ref 1.005–1.030)
pH: 5 (ref 5.0–8.0)

## 2022-08-14 LAB — CBC
HCT: 38.1 % (ref 36.0–46.0)
Hemoglobin: 12.6 g/dL (ref 12.0–15.0)
MCH: 28.6 pg (ref 26.0–34.0)
MCHC: 33.1 g/dL (ref 30.0–36.0)
MCV: 86.4 fL (ref 80.0–100.0)
Platelets: 270 10*3/uL (ref 150–400)
RBC: 4.41 MIL/uL (ref 3.87–5.11)
RDW: 13.5 % (ref 11.5–15.5)
WBC: 7.7 10*3/uL (ref 4.0–10.5)
nRBC: 0 % (ref 0.0–0.2)

## 2022-08-14 LAB — HCG, QUANTITATIVE, PREGNANCY: hCG, Beta Chain, Quant, S: 6521 m[IU]/mL — ABNORMAL HIGH (ref <5)

## 2022-08-14 LAB — BASIC METABOLIC PANEL
Anion gap: 7 (ref 5–15)
BUN: 9 mg/dL (ref 6–20)
CO2: 23 mmol/L (ref 22–32)
Calcium: 8.9 mg/dL (ref 8.9–10.3)
Chloride: 107 mmol/L (ref 98–111)
Creatinine, Ser: 0.54 mg/dL (ref 0.44–1.00)
GFR, Estimated: >60 mL/min (ref >60)
Glucose, Bld: 97 mg/dL (ref 70–99)
Potassium: 3.7 mmol/L (ref 3.5–5.1)
Sodium: 137 mmol/L (ref 135–145)

## 2022-08-14 LAB — ABO/RH: ABO/RH(D): O POS

## 2022-08-14 NOTE — ED Triage Notes (Signed)
Pt via POV from home. Pt c/o vaginal bleeding starting yesterday. States that she was seen last week for same but this time she had cramping yesterday and a blood clot.  Pt is approx [redacted] weeks pregnant. Pt is A&OX4 and NAD

## 2022-08-14 NOTE — Discharge Instructions (Addendum)
Keep your appointment with your OB/GYN tomorrow.  Turn to the emergency department if any severe worsening of your symptoms or urgent concerns.

## 2022-08-14 NOTE — ED Provider Notes (Signed)
[facility] Provider Note    Event Date/Time   First MD Initiated Contact with Patient 08/14/22 [phone]     (approximate)   History   Vaginal Bleeding   HPI  Deanna Ward is a 22 y.o. female   presents to the ED with concerns of vaginal bleeding.  Patient is approximately [redacted] weeks pregnant and states that although she is not bleeding heavily and no need for a pad she did see a clot of blood this morning when she peed.  Patient was in the emergency department on 08/09/2022 but left without being seen.      Physical Exam   Triage Vital Signs: ED Triage Vitals  Enc Vitals Group     BP 08/14/22 0758 (!) 112/99     Pulse Rate 08/14/22 0758 94     Resp 08/14/22 0758 18     Temp 08/14/22 0758 97.9 F (36.6 C)     Temp src --      SpO2 08/14/22 0758 95 %     Weight 08/14/22 0756 140 lb (63.5 kg)     Height 08/14/22 0756 5\' 2"  (1.575 m)     Head Circumference --      Peak Flow --      Pain Score 08/14/22 0756 0     Pain Loc --      Pain Edu? --      Excl. in [place]? --     Most recent vital signs: Vitals:   08/14/22 0758 08/14/22 1106  BP: (!) 112/99 119/80  Pulse: 94   Resp: 18   Temp: 97.9 F (36.6 C)   SpO2: 95%      General: Awake, no distress.  CV:  Good peripheral perfusion.  Resp:  Normal effort.  Abd:  No distention.  Other:     ED Results / Procedures / Treatments   Labs (all labs ordered are listed, but only abnormal results are displayed) Labs Reviewed  HCG, QUANTITATIVE, PREGNANCY - Abnormal; Notable for the following components:      Result Value   hCG, Beta Chain, Quant, S 6,521 (*)    All other components within normal limits  URINALYSIS, ROUTINE W REFLEX MICROSCOPIC - Abnormal; Notable for the following components:   Color, Urine YELLOW (*)    APPearance HAZY (*)    Hgb urine dipstick LARGE (*)    Leukocytes,Ua TRACE (*)    Bacteria, UA RARE (*)    All other components within normal limits  CBC  BASIC  METABOLIC PANEL  ABO/RH      RADIOLOGY  Ultrasound of the less than 14 weeks per radiologist shows a single IUP at 6 weeks and 3 days with a heart rate of 114.     PROCEDURES:  Critical Care performed:   Procedures   MEDICATIONS ORDERED IN ED: Medications - No data to display   IMPRESSION / MDM / ASSESSMENT AND PLAN / ED COURSE  I reviewed the triage vital signs and the nursing notes.   Differential diagnosis includes, but is not limited to, vaginal bleeding, threatened abortion, spotting with early pregnancy, subchorionic hemorrhage.  22 year old female presents to the ED with complaint concerns of vaginal bleeding.  Patient has an appointment with her OB/GYN tomorrow but was concerned.  She has not had to use any pads for her vaginal bleeding.  hCG 6521, ABO/ Rh was O+, CBC unremarkable.  Ultrasound as noted above.  Patient has an appointment with her OB/GYN tomorrow  and is encouraged to keep that appointment.      Patient's presentation is most consistent with acute illness / injury with system symptoms.  FINAL CLINICAL IMPRESSION(S) / ED DIAGNOSES   Final diagnoses:  First trimester bleeding     Rx / DC Orders   ED Discharge Orders     None        Note:  This document was prepared using Dragon voice recognition software and may include unintentional dictation errors.   Tommi Rumps, PA-C 08/14/22 1452    Pilar Jarvis, MD 08/14/22 [phone]

## 2022-08-14 NOTE — ED Notes (Signed)
Report to candace, rn.  

## 2022-08-22 ENCOUNTER — Inpatient Hospital Stay (HOSPITAL_COMMUNITY): Payer: Commercial Managed Care - PPO

## 2022-08-22 ENCOUNTER — Inpatient Hospital Stay (HOSPITAL_COMMUNITY)
Admission: AD | Admit: 2022-08-22 | Discharge: 2022-08-22 | Disposition: A | Discharge status: Home / Self Care | Payer: Commercial Managed Care - PPO | Attending: Obstetrics and Gynecology | Admitting: Obstetrics and Gynecology

## 2022-08-22 ENCOUNTER — Encounter (HOSPITAL_COMMUNITY): Payer: Self-pay | Admitting: Obstetrics and Gynecology

## 2022-08-22 DIAGNOSIS — Z3A01 Less than 8 weeks gestation of pregnancy: Secondary | ICD-10-CM | POA: Diagnosis not present

## 2022-08-22 DIAGNOSIS — O209 Hemorrhage in early pregnancy, unspecified: Secondary | ICD-10-CM

## 2022-08-22 DIAGNOSIS — Z6790 Unspecified blood type, Rh positive: Secondary | ICD-10-CM | POA: Diagnosis not present

## 2022-08-22 DIAGNOSIS — O039 Complete or unspecified spontaneous abortion without complication: Secondary | ICD-10-CM | POA: Diagnosis present

## 2022-08-22 LAB — URINALYSIS, ROUTINE W REFLEX MICROSCOPIC
Bacteria, UA: NONE SEEN
Bilirubin Urine: NEGATIVE
Glucose, UA: NEGATIVE mg/dL
Ketones, ur: NEGATIVE mg/dL
Leukocytes,Ua: NEGATIVE
Nitrite: NEGATIVE
Protein, ur: 100 mg/dL — AB
RBC / HPF: >50 RBC/hpf (ref 0–5)
Specific Gravity, Urine: 1.017 (ref 1.005–1.030)
pH: 6 (ref 5.0–8.0)

## 2022-08-22 MED ORDER — TRAMADOL HCL 50 MG PO TABS: 50.0000 mg | ORAL_TABLET | Freq: Four times a day (QID) | ORAL | 0 refills | Status: DC | PRN

## 2022-08-22 NOTE — MAU Provider Note (Signed)
Chief Complaint: Abdominal Pain and Vaginal Bleeding   Event Date/Time   First Provider Initiated Contact with Patient 08/22/22 0209        SUBJECTIVE HPI: Deanna Ward is a 22 y.o. G2P0010 at [redacted]w[redacted]d by 6 wk [place] who presents to maternity admissions reporting increased bleeding with passage of clots.  Known subchorionic hemorrhage.. She denies vaginal bleeding, vaginal itching/burning, urinary symptoms, h/a, dizziness, n/v, or fever/chills.   Gets care in [place], was staying in [place] today  Abdominal Pain This is a new problem. The current episode started in the past 7 days. The quality of the pain is cramping. Pertinent negatives include no constipation, diarrhea, dysuria, frequency, myalgias or nausea. Nothing aggravates the pain. The pain is relieved by Nothing.  Vaginal Bleeding Associated symptoms include abdominal pain. Pertinent negatives include no constipation, diarrhea, dysuria, frequency or nausea.   RN Note: Deanna Ward is a 22 y.o. at Unknown here in MAU reporting: ongoing VB due to sub, yesterday around 2300 after she got up from a nap she went to the bathroom she noticed a flow of dark red "almost black' VB with many clots that has continued. Pt states she has went through one fully saturated pad. Pt report intermittent lower ABD pain. Pt denies pain medications. Pt denies LOF or loss of tissue or sac.  Known Subchor. Hemophage  Onset of complaint: 2300 yesterday  Pain score: 7/10  History reviewed. No pertinent past medical history. History reviewed. No pertinent surgical history. Social History   Socioeconomic History   Marital status: Single    Spouse name: Not on file   Number of children: Not on file   Years of education: Not on file   Highest education level: Not on file  Occupational History   Not on file  Tobacco Use   Smoking status: Never   Smokeless tobacco: Never  Vaping Use   Vaping Use: Never used  Substance and Sexual Activity    Alcohol use: Not Currently    Alcohol/week: 2.0 standard drinks of alcohol    Types: 2 Standard drinks or equivalent per week    Comment: last use 10/19/21 q weekend   Drug use: Not Currently    Types: Marijuana    Comment: last use 05/2021   Sexual activity: Yes    Partners: Male    Birth control/protection: None  Other Topics Concern   Not on file  Social History Narrative   Not on file   Social Determinants of Health   Financial Resource Strain: Not on file  Food Insecurity: Not on file  Transportation Needs: Not on file  Physical Activity: Not on file  Stress: Not on file  Social Connections: Not on file  Intimate Partner Violence: Not At Risk (08/16/2021)   Humiliation, Afraid, Rape, and Kick questionnaire    Fear of Current or Ex-Partner: No    Emotionally Abused: No    Physically Abused: No    Sexually Abused: No   No current facility-administered medications on file prior to encounter.   Current Outpatient Medications on File Prior to Encounter  Medication Sig Dispense Refill   Prenatal Vit-Fe Fumarate-FA (MULTIVITAMIN-PRENATAL) 27-0.8 MG TABS tablet Take 1 tablet by mouth daily at 12 noon.     ferrous sulfate 325 (65 FE) MG EC tablet Take 1 tablet (325 mg total) by mouth daily with breakfast. 30 tablet 0   fluticasone (FLONASE) 50 MCG/ACT nasal spray Place 2 sprays into both nostrils daily. (Patient not taking: Reported on  08/02/2020) 16 g 6   Norgestimate-Ethinyl Estradiol Triphasic 0.18/0.215/0.25 MG-25 MCG tab Take 1 tablet by mouth daily. (Patient not taking: Reported on 08/02/2020) 3 Package 2   No Known Allergies  I have reviewed patient's Past Medical Hx, Surgical Hx, Family Hx, Social Hx, medications and allergies.   ROS:  Review of Systems  Gastrointestinal:  Positive for abdominal pain. Negative for constipation, diarrhea and nausea.  Genitourinary:  Positive for vaginal bleeding. Negative for dysuria and frequency.  Musculoskeletal:  Negative for myalgias.    Review of Systems  Other systems negative   Physical Exam  Physical Exam Patient Vitals for the past 24 hrs:  BP Temp Temp src Pulse Resp SpO2 Height Weight  08/22/22 0137 120/74 98.3 F (36.8 C) Oral 79 18 97 % 5\' 2"  (1.575 m) --  08/22/22 0132 -- -- -- -- -- -- -- (P) 70.9 kg   Constitutional: Well-developed, well-nourished female in no acute distress.  Cardiovascular: normal rate Respiratory: normal effort GI: Abd soft, non-tender.  MS: Extremities nontender, no edema, normal ROM Neurologic: Alert and oriented x 4.  GU: Neg CVAT.  PELVIC EXAM: deferred in lieu of TV US  LAB RESULTS Results for orders placed or performed during the hospital encounter of 08/22/22 (from the past 24 hour(s))  Urinalysis, Routine w reflex microscopic -Urine, Clean Catch     Status: Abnormal   Collection Time: 08/22/22  2:14 AM  Result Value Ref Range   Color, Urine RED (A) YELLOW   APPearance CLOUDY (A) CLEAR   Specific Gravity, Urine 1.017 1.005 - 1.030   pH 6.0 5.0 - 8.0   Glucose, UA NEGATIVE NEGATIVE mg/dL   Hgb urine dipstick MODERATE (A) NEGATIVE   Bilirubin Urine NEGATIVE NEGATIVE   Ketones, ur NEGATIVE NEGATIVE mg/dL   Protein, ur 161 (A) NEGATIVE mg/dL   Nitrite NEGATIVE NEGATIVE   Leukocytes,Ua NEGATIVE NEGATIVE   RBC / HPF >50 0 - 5 RBC/hpf   WBC, UA 0-5 0 - 5 WBC/hpf   Bacteria, UA NONE SEEN NONE SEEN   Squamous Epithelial / HPF 0-5 0 - 5 /HPF     --/--/O POS Performed at [facility], [address] [place], [place] [address]  (05/15 0800)  IMAGING US OB Transvaginal  Result Date: 08/22/2022 CLINICAL DATA:  Pregnant, assigned gestational age [redacted] weeks, 0 days, vaginal bleeding. EXAM: OBSTETRIC <14 WK ULTRASOUND TECHNIQUE: Transabdominal ultrasound was performed for evaluation of the gestation as well as the maternal uterus and adnexal regions. COMPARISON:  08/14/2022 FINDINGS: Intrauterine gestational sac: Not visualized Maternal uterus/adnexae: The  previously identified intrauterine gestational sac is no longer visualized. The uterus is anteverted. The cervix is closed and is unremarkable. The intra Petri a measures up to 9 mm in thickness, best seen on cine imaging. No intrauterine masses are seen. A trace amount of simple appearing free fluid is seen within the pelvis. The maternal ovaries are unremarkable. IMPRESSION: Interval spontaneous abortion. Electronically Signed   By: Helyn Numbers M.D.   On: 08/22/2022 02:39     MAU Management/MDM: I have reviewed the triage vital signs and the nursing notes.   Pertinent labs & imaging results that were available during my care of the patient were reviewed by me and considered in my medical decision making (see chart for details).      I have reviewed her medical records including past results, notes and treatments. Medical, Surgical, and family history were reviewed.  Medications and recent lab tests were reviewed  Ordered ultrasound to rule out SAB.  Reviewed US findings of interval spontaneous abortion Discussed process of SAB, warning signs Will Rx Tramadol for pain Pt to f/u with office  ASSESSMENT Pregnancy at [redacted]w[redacted]d by [place] Interval spontaneous abortion, complete Blood type O+  PLAN Discharge home Bleeding precautions Rx Tramadol for pain Follow up in office, call in AM  Pt stable at time of discharge. Encouraged to return here if she develops worsening of symptoms, increase in pain, fever, or other concerning symptoms.    Wynelle Bourgeois CNM, MSN Certified Nurse-Midwife 08/22/2022  2:09 AM

## 2022-08-22 NOTE — MAU Note (Signed)
.  Deanna Ward is a 22 y.o. at Unknown here in MAU reporting: ongoing VB due to sub, yesterday around 2300 after she got up from a nap she went to the bathroom she noticed a flow of dark red "almost black' VB with many clots that has continued. Pt states she has went through one fully saturated pad. Pt report intermittent lower ABD pain. Pt denies pain medications. Pt denies LOF or loss of tissue or sac.  Known Subchor. Hemophage  Onset of complaint: 2300 yesterday  Pain score: 7/10 Vitals:   08/22/22 0137  BP: 120/74  Pulse: 79  Resp: 18  Temp: 98.3 F (36.8 C)  SpO2: 97%   Lab orders placed from triage:  UA

## 2022-10-02 ENCOUNTER — Encounter: Payer: Self-pay | Admitting: Advanced Practice Midwife

## 2022-10-02 ENCOUNTER — Ambulatory Visit: Payer: Commercial Managed Care - PPO | Admitting: Advanced Practice Midwife

## 2022-10-02 DIAGNOSIS — Z720 Tobacco use: Secondary | ICD-10-CM

## 2022-10-02 DIAGNOSIS — Z113 Encounter for screening for infections with a predominantly sexual mode of transmission: Secondary | ICD-10-CM

## 2022-10-02 HISTORY — DX: Tobacco use: Z72.0

## 2022-10-02 LAB — WET PREP FOR TRICH, YEAST, CLUE
Trichomonas Exam: NEGATIVE
Yeast Exam: NEGATIVE

## 2022-10-02 LAB — HM HEPATITIS C SCREENING LAB: HM Hepatitis Screen: NEGATIVE

## 2022-10-02 LAB — HM HIV SCREENING LAB: HM HIV Screening: NEGATIVE

## 2022-10-02 NOTE — Progress Notes (Signed)
[facility] Department  STI clinic/screening visit [address] [place] [address] [phone]  Subjective:  Deanna Ward is a 22 y.o. SHF vaper G2P0 female being seen today for an STI screening visit. The patient reports they do not have symptoms.  Patient reports that they do not desire a pregnancy in the next year.   They reported they are not interested in discussing contraception today.    Patient's last menstrual period was 09/23/2022 (exact date).  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 LMP 09/23/22. Last sex 09/19/22 without condom; with current partner x 2 years; 1 partner in last 3 mo. Last MJ 07/2022. Last ETOH 09/27/22 (2 cocktails) 3x/mo. Last pap age 42 neg.   Does the patient using douching products? No  Last HIV test per patient/review of record was  Lab Results  Component Value Date   HMHIVSCREEN Negative - Validated 08/16/2021   No results found for: "HIV" Patient reports last pap was No results found for: "DIAGPAP"  Lab Results  Component Value Date   SPECADGYN Comment 08/02/2020    Screening for MPX risk: Does the patient have an unexplained rash? No Is the patient MSM? No Does the patient endorse multiple sex partners or anonymous sex partners? No Did the patient have close or sexual contact with a person diagnosed with MPX? No Has the patient traveled outside the [place] where MPX is endemic? No Is there a high clinical suspicion for MPX-- evidenced by one of the following No  -Unlikely to be  chickenpox  -Lymphadenopathy  -Rash that present in same phase of evolution on any given body part See flowsheet for further details and programmatic requirements.   Immunization history:  Immunization History  Administered Date(s) Administered   Hepatitis A 02/04/2007, 11/20/2007   Hepatitis B 11-12-98, 09/28/1999, 10/15/1999, 08/15/2003   Hpv-Unspecified 09/29/2009, 12/01/2009, 10/12/2010   Meningococcal Mcv4o 01/30/2018   Tdap 01/30/2018     The following portions of the patient's history were reviewed and updated as appropriate: allergies, current medications, past medical history, past social history, past surgical history and problem list.  Objective:  There were no vitals filed for this visit.  Physical Exam Vitals and nursing note reviewed.  Constitutional:      Appearance: Normal appearance. She is normal weight.  HENT:     Head: Normocephalic and atraumatic.     Mouth/Throat:     Mouth: Mucous membranes are moist.     Pharynx: Oropharynx is clear. No oropharyngeal exudate or posterior oropharyngeal erythema.  Eyes:     Conjunctiva/sclera: Conjunctivae normal.  Pulmonary:     Effort: Pulmonary effort is normal.  Abdominal:     General: Abdomen is flat.     Palpations: Abdomen is soft. There is no mass.     Tenderness: There is no abdominal tenderness. There is no rebound.     Comments: Soft without masses or tenderness, good tone  Genitourinary:    General: Normal vulva.     Exam position: Lithotomy position.     Pubic Area: No  rash or pubic lice.      Labia:        Right: No rash or lesion.        Left: No rash or lesion.      Vagina: Vaginal discharge (white mucousy leukorrhea, ph<4.5) present. No erythema, bleeding or lesions.     Cervix: Normal.     Uterus: Normal.      Adnexa: Right adnexa normal and left adnexa normal.     Rectum: Normal.     Comments: pH = <4.5 Lymphadenopathy:     Head:     Right side of head: No preauricular or posterior  auricular adenopathy.     Left side of head: No preauricular or posterior auricular adenopathy.     Cervical: No cervical adenopathy.     Right cervical: No superficial, deep or posterior cervical adenopathy.    Left cervical: No superficial, deep or posterior cervical adenopathy.     Upper Body:     Right upper body: No supraclavicular, axillary or epitrochlear adenopathy.     Left upper body: No supraclavicular, axillary or epitrochlear adenopathy.     Lower Body: No right inguinal adenopathy. No left inguinal adenopathy.  Skin:    General: Skin is warm and dry.     Findings: No rash.  Neurological:     Mental Status: She is alert and oriented to person, place, and time.      Assessment and Plan:  [name] is a 22 y.o. female presenting to the [place] [facility] Department for STI screening  1. Screening examination for venereal disease Treat wet mount per standing orders Immunization nurse consult  - WET PREP FOR TRICH, YEAST, CLUE - Chlamydia/Gonorrhea [facility] - Syphilis Serology, Onset Lab - HIV/HCV [facility] - Gonococcus culture  2. Vapes nicotine containing substance Counseled via 5 A's to stop   Patient accepted all screenings including oral, vaginal CT/GC and bloodwork for HIV/RPR, and wet prep. Patient meets criteria for HepB screening? No. Ordered? no Patient meets criteria for HepC screening? Yes. Ordered? yes  Treat wet prep per standing order Discussed time line for State Lab results and that patient will be called with positive results and encouraged patient to call if she had not heard in 2 weeks.  Counseled to return or seek care for continued or worsening symptoms Recommended repeat testing in 3 months with positive results. Recommended condom use with all sex  Patient is currently using  nothing  to prevent pregnancy.    Return if symptoms worsen or fail to improve.  No future appointments.  Alberteen Spindle, CNM

## 2022-10-02 NOTE — Progress Notes (Signed)
Pt is here for STD screening.  Wet mount results reviewed, no treatment required per SO.  Condoms declined.  [place] Stegemann M Jaye Saal, RN  

## 2022-10-06 LAB — GONOCOCCUS CULTURE

## 2022-12-18 ENCOUNTER — Ambulatory Visit: Payer: Commercial Managed Care - PPO | Admitting: Family Medicine

## 2022-12-18 DIAGNOSIS — Z113 Encounter for screening for infections with a predominantly sexual mode of transmission: Secondary | ICD-10-CM

## 2022-12-18 LAB — HM HIV SCREENING LAB: HM HIV Screening: NEGATIVE

## 2022-12-18 LAB — WET PREP FOR TRICH, YEAST, CLUE
Trichomonas Exam: NEGATIVE
Yeast Exam: NEGATIVE

## 2022-12-18 NOTE — Progress Notes (Signed)
[facility] Department  STI clinic/screening visit [address] [place] [address] [phone]  Subjective:  Syeeda Godard is a 22 y.o. female being seen today for an STI screening visit. The patient reports they do not have symptoms.  Patient reports that they do not desire a pregnancy in the next year.   They reported they are not interested in discussing contraception today.    Patient's last menstrual period was 12/08/2022 (exact date).  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   HPI  Patient reports no symptoms. She is here for routine screening.   Does the patient using douching products? No  Last HIV test per patient/review of record was  Lab Results  Component Value Date   HMHIVSCREEN Negative - Validated 10/02/2022   No results found for: "HIV" Patient reports last pap was No results found for: "DIAGPAP"  Lab Results  Component Value Date   SPECADGYN Comment 08/02/2020    Screening for MPX risk: Does the patient have an unexplained rash? No Is the patient MSM? No Does the patient endorse multiple sex partners or anonymous sex partners? No Did the patient have close or sexual contact with a person diagnosed with MPX? No Has the patient traveled outside the [place] where MPX is endemic? No Is there a high clinical suspicion for MPX-- evidenced by one of the following No  -Unlikely to be chickenpox  -Lymphadenopathy  -Rash that present in same phase of evolution on any given body part See flowsheet for further details and programmatic requirements.   Immunization history:  Immunization History   Administered Date(s) Administered   Hepatitis A 02/04/2007, 11/20/2007   Hepatitis B 15-Aug-1998, 09/28/1999, 10/15/1999, 08/15/2003   Hpv-Unspecified 09/29/2009, 12/01/2009, 10/12/2010   Meningococcal Mcv4o 01/30/2018   Tdap 01/30/2018     The following portions of the patient's history were reviewed and updated as appropriate: allergies, current medications, past medical history, past social history, past surgical history and problem list.  Objective:  There were no vitals filed for this visit.  Physical Exam Constitutional:      Appearance: Normal appearance.  HENT:     Head: Normocephalic.     Mouth/Throat:     Lips: Pink.     Mouth: Mucous membranes are moist.     Pharynx: No oropharyngeal exudate.  Eyes:     General:        Right eye: No discharge.        Left eye: No discharge.  Pulmonary:     Effort: Pulmonary effort is normal.  Genitourinary:    Comments: Declined genital exam- no symptoms, self swabbed Lymphadenopathy:     Head:     Right side of head: No submental, submandibular, tonsillar, preauricular or posterior auricular adenopathy.     Left side of head: No submental, submandibular, tonsillar, preauricular or posterior auricular adenopathy.     Cervical: No cervical adenopathy.     Right cervical: No superficial or posterior cervical adenopathy.    Left cervical: No superficial or posterior cervical adenopathy.     Upper Body:     Right upper body: No supraclavicular or axillary adenopathy.  Left upper body: No supraclavicular or axillary adenopathy.  Skin:    General: Skin is warm and dry.          Comments: Patient positive for eczema on back side of both hands.  Neurological:     Mental Status: She is alert and oriented to person, place, and time.  Psychiatric:        Attention and Perception: Attention normal.        Mood and Affect: Mood normal.        Speech: Speech normal.        Behavior: Behavior is cooperative.      Assessment and  Plan:  Aubriella Shott is a 22 y.o. female presenting to the [facility] Department for STI screening  1. Screening for venereal disease  - Chlamydia/Gonorrhea [facility] - HIV [place] [facility] - Syphilis Serology, [place] - WET PREP FOR TRICH, YEAST, CLUE - Gonococcus culture  Patient accepted all screenings including oral, vaginal CT/GC and bloodwork for HIV/RPR, and wet prep. Patient meets criteria for HepB screening? No. Ordered? not applicable Patient meets criteria for HepC screening? No. Ordered? not applicable  Treat wet prep per standing order Discussed time line for [facility] results and that patient will be called with positive results and encouraged patient to call if she had not heard in 2 weeks.  Counseled to return or seek care for continued or worsening symptoms Recommended repeat testing in 3 months with positive results. Recommended condom use with all sex  Patient is currently not using  a method to prevent pregnancy. She declined condoms at today's visit and stated she was not interested in discussing birth control methods.   Return if symptoms worsen or fail to improve.  No future appointments.  Total time with patient minutes.  Edmonia James, NP

## 2022-12-18 NOTE — Progress Notes (Signed)

## 2022-12-22 LAB — GONOCOCCUS CULTURE

## 2023-12-04 ENCOUNTER — Ambulatory Visit

## 2023-12-04 DIAGNOSIS — Z113 Encounter for screening for infections with a predominantly sexual mode of transmission: Secondary | ICD-10-CM

## 2023-12-04 LAB — HM HIV SCREENING LAB: HM HIV Screening: NEGATIVE

## 2023-12-04 LAB — WET PREP FOR TRICH, YEAST, CLUE
Clue Cell Exam: NEGATIVE
Trichomonas Exam: NEGATIVE
Yeast Exam: NEGATIVE

## 2023-12-04 NOTE — Progress Notes (Signed)
 Pt is here for STD Screening. Wet prep results reviewed with patient and requires no treatment SO. Condoms declined. Wilkie Drought, RN.

## 2023-12-04 NOTE — Progress Notes (Signed)
 [facility] Department STI clinic [address] [place] [address] Main phone: [phone]  STI screening visit  Subjective:  Deanna Ward is a 23 y.o. female being seen today for an STI screening visit. The patient reports they do have symptoms.  Patient reports that they do not desire a pregnancy in the next year.   They reported they are not interested in discussing contraception today.    Patient's last menstrual period was 11/15/2023 (exact date).  Patient has the following medical conditions:  Patient Active Problem List   Diagnosis Date Noted   SAB (spontaneous abortion) 08/22/2022   Rash and nonspecific skin eruption 06/10/2018   Seasonal allergies 06/10/2018   Menorrhagia with irregular cycle 03/03/2018   Iron deficiency anemia 03/03/2018   Chief Complaint  Patient presents with   SEXUALLY TRANSMITTED DISEASE   HPI Patient reports desire for STI testing. Feels her pH is off, is having discharge and odor for several days. No other symptoms. No contacts for any particular STI.  See flowsheet for further details and programmatic requirements Hyperlink available at the top of the signed note in blue.  Flow sheet content below:  Pregnancy Intention Screening Does the patient want to become pregnant in the next year?: No Does the patient's partner want to become pregnant in the next year?: No Would the patient like to discuss contraceptive options today?: No Reason For STD Screen STD Screening: Has symptoms Have you ever had an STD?: No History of Antibiotic use in the past 2 weeks?: No STD Symptoms Denies all: No Genital Itching: No Lower abdominal pain: No Discharge: Yes Dysuria: No Genital ulcer / lesion: No Rash: No Vaginal irritation: No Oral / Other skin ulcer: No Pain with sex: No Sore Throat: No Visual Changes: No Vaginal Bleeding: No Risk Factors for Hep B Household, sexual, or needle sharing contact of  a person infected with Hep B: No Sexual contact with a person who uses drugs not as prescribed?: No Currently or Ever used drugs not as prescribed: No HIV Positive: No PRep Patient: No Men who have sex with men: No Have Hepatitis C: No History of Incarceration: No History of Homeslessness?: No Anal sex following anal drug use?: No Risk Factors for Hep C Currently using drugs not as prescribed: No Sexual partner(s) currently using drugs as not prescribed: No History of drug use: No HIV Positive: No People with a history of incarceration: No People born between the years of 17 and 36: No Abuse History Has patient ever been abused physically?: No Has patient ever been abused sexually?: No Does patient feel they have a problem with Anxiety?: No Does patient feel they have a problem with Depression?: No Counseling Patient counseled to use condoms with all sex: Condoms declined RTC in 2-3 weeks for test results: Yes Clinic will call if test results abnormal before test result appt.: Yes Test results given to patient Patient counseled to use condoms with all sex: Condoms declined   Screening for MPX risk: Does the patient have an unexplained rash? No Is the patient MSM? No Does the patient endorse multiple sex partners or anonymous sex partners? No Did the patient have close or sexual contact with a person diagnosed with MPX? No Has the patient traveled outside the [place]  where MPX is endemic? No Is there a high clinical suspicion for MPX-- evidenced by one of the following No  -Unlikely to be chickenpox  -Lymphadenopathy  -Rash that present in same phase  of evolution on any given body part  Screenings: Last HIV test per patient/review of record was  Lab Results  Component Value Date   HMHIVSCREEN Negative - Validated 12/18/2022   No results found for: HIV   Last HEPC test per patient/review of record was  Lab Results  Component Value Date   HMHEPCSCREEN  Negative-Validated 10/02/2022   No components found for: HEPC   Last HEPB test per patient/review of record was No components found for: HMHEPBSCREEN   Patient reports last pap was:   Lab Results  Component Value Date   SPECADGYN Comment 08/02/2020   Result Date Procedure Results Follow-ups  08/02/2020 Pap IG (Image Guided) DIAGNOSIS:: Comment Specimen adequacy:: Comment Clinician Provided ICD10: Comment Performed by:: Comment PAP Smear Comment: . Note:: Comment Test Methodology: Comment     Immunization history:  Immunization History  Administered Date(s) Administered   Hepatitis A 02/04/2007, 11/20/2007   Hepatitis B 2001/02/24, 09/28/1999, 10/15/1999, 08/15/2003   Hpv-Unspecified 09/29/2009, 12/01/2009, 10/12/2010   Meningococcal Mcv4o 01/30/2018   Tdap 01/30/2018    The following portions of the patient's history were reviewed and updated as appropriate: allergies, current medications, past medical history, past social history, past surgical history and problem list.  Objective:  There were no vitals filed for this visit.  Physical Exam Vitals and nursing note reviewed. Exam conducted with a chaperone present Brett Orange).  Constitutional:      Appearance: Normal appearance.  HENT:     Head: Normocephalic and atraumatic.     Mouth/Throat:     Lips: Pink. No lesions.     Mouth: Mucous membranes are moist.  Pulmonary:     Effort: Pulmonary effort is normal.  Abdominal:     General: Abdomen is flat.  Genitourinary:    General: Normal vulva.     Exam position: Lithotomy position.     Pubic Area: No rash or pubic lice.      Labia:        Right: No rash or lesion.        Left: No rash or lesion.      Vagina: Normal. No vaginal discharge, erythema, bleeding or lesions.     Cervix: No cervical motion tenderness, discharge, friability, lesion or erythema.     Comments: pH = <4.5 Lymphadenopathy:     Head:     Right side of head: No preauricular or posterior  auricular adenopathy.     Left side of head: No preauricular or posterior auricular adenopathy.     Cervical: No cervical adenopathy.     Upper Body:     Right upper body: No supraclavicular, axillary or epitrochlear adenopathy.     Left upper body: No supraclavicular, axillary or epitrochlear adenopathy.     Lower Body: No right inguinal adenopathy. No left inguinal adenopathy.  Skin:    General: Skin is warm and dry.     Findings: No rash.  Neurological:     Mental Status: She is alert and oriented to person, place, and time.    Assessment and Plan:  Hatsue Reyes Urias is a 23 y.o. female presenting to the [facility] Department for STI screening  1. Screening for venereal disease (Primary)  - WET PREP FOR TRICH, YEAST, CLUE - Chlamydia/Gonorrhea [place] [facility] - HIV [place] [facility] - Syphilis Serology, [facility]  Patient accepted the following screenings: vaginal CT/GC swab, vaginal wet prep, HIV, and RPR Patient meets criteria for HepB screening? No. Ordered? no Patient meets  criteria for HepC screening? No. Ordered? no  Treat wet prep per standing order Discussed time line for State Lab results and that patient will be called with positive results and encouraged patient to call if she had not heard in 2 weeks.  Counseled to return or seek care for continued or worsening symptoms Recommended repeat testing in 3 months with positive results. Recommended condom use with all sex for STI prevention.   Patient is currently using condoms to prevent pregnancy.    Return if symptoms worsen or fail to improve.  No future appointments.  Damien FORBES Satchel, NP
